# Patient Record
Sex: Male | Born: 1955 | Race: White | Hispanic: No | Marital: Single | Smoking: Current every day smoker
Health system: Southern US, Community
[De-identification: ages and names within clinical notes are randomized; demographics above are authoritative.]

## PROBLEM LIST (undated history)

## (undated) DIAGNOSIS — B20 Human immunodeficiency virus [HIV] disease: Secondary | ICD-10-CM

## (undated) DIAGNOSIS — J449 Chronic obstructive pulmonary disease, unspecified: Secondary | ICD-10-CM

## (undated) DIAGNOSIS — M1990 Unspecified osteoarthritis, unspecified site: Secondary | ICD-10-CM

## (undated) DIAGNOSIS — Z21 Asymptomatic human immunodeficiency virus [HIV] infection status: Secondary | ICD-10-CM

## (undated) HISTORY — DX: Unspecified osteoarthritis, unspecified site: M19.90

## (undated) HISTORY — PX: APPENDECTOMY: SHX54

---

## 1997-12-21 ENCOUNTER — Encounter: Admission: RE | Admit: 1997-12-21 | Discharge: 1997-12-21 | Payer: Self-pay | Admitting: Family Medicine

## 2009-03-04 ENCOUNTER — Emergency Department (HOSPITAL_COMMUNITY): Admission: EM | Admit: 2009-03-04 | Discharge: 2009-03-04 | Payer: Self-pay | Admitting: Family Medicine

## 2009-03-04 ENCOUNTER — Emergency Department (HOSPITAL_COMMUNITY): Admission: EM | Admit: 2009-03-04 | Discharge: 2009-03-04 | Payer: Self-pay | Admitting: Emergency Medicine

## 2009-03-06 ENCOUNTER — Emergency Department (HOSPITAL_COMMUNITY): Admission: EM | Admit: 2009-03-06 | Discharge: 2009-03-06 | Payer: Self-pay | Admitting: Emergency Medicine

## 2009-03-06 ENCOUNTER — Emergency Department (HOSPITAL_COMMUNITY): Admission: EM | Admit: 2009-03-06 | Discharge: 2009-03-06 | Payer: Self-pay | Admitting: Family Medicine

## 2009-11-03 ENCOUNTER — Emergency Department (HOSPITAL_COMMUNITY): Admission: EM | Admit: 2009-11-03 | Discharge: 2009-11-03 | Payer: Self-pay | Admitting: Emergency Medicine

## 2010-03-21 LAB — DIFFERENTIAL
Eosinophils Absolute: 0.3 10*3/uL (ref 0.0–0.7)
Eosinophils Relative: 4 % (ref 0–5)
Lymphs Abs: 2.6 10*3/uL (ref 0.7–4.0)
Monocytes Absolute: 0.5 10*3/uL (ref 0.1–1.0)
Monocytes Relative: 7 % (ref 3–12)

## 2010-03-21 LAB — CBC
HCT: 45.3 % (ref 39.0–52.0)
MCH: 33 pg (ref 26.0–34.0)
MCV: 93.4 fL (ref 78.0–100.0)
Platelets: 219 10*3/uL (ref 150–400)
RBC: 4.85 MIL/uL (ref 4.22–5.81)

## 2010-03-21 LAB — POCT I-STAT, CHEM 8
BUN: 18 mg/dL (ref 6–23)
Calcium, Ion: 1.13 mmol/L (ref 1.12–1.32)
Creatinine, Ser: 1.1 mg/dL (ref 0.4–1.5)
Glucose, Bld: 110 mg/dL — ABNORMAL HIGH (ref 70–99)
Hemoglobin: 17 g/dL (ref 13.0–17.0)
Sodium: 140 mEq/L (ref 135–145)
TCO2: 25 mmol/L (ref 0–100)

## 2010-03-21 LAB — POCT CARDIAC MARKERS
CKMB, poc: <1 ng/mL — ABNORMAL LOW (ref 1.0–8.0)
Myoglobin, poc: 41.1 ng/mL (ref 12–200)
Troponin i, poc: <0.05 ng/mL (ref 0.00–0.09)

## 2010-03-29 LAB — RAPID STREP SCREEN (MED CTR MEBANE ONLY): Streptococcus, Group A Screen (Direct): NEGATIVE

## 2011-11-10 IMAGING — CR DG CHEST 2V
2 series · 2 of 2 positions shown · non-contrast
Comparison: None

CLINICAL DATA: Chest pain and short of breath

CHEST - 2 VIEW

[w chest pa]
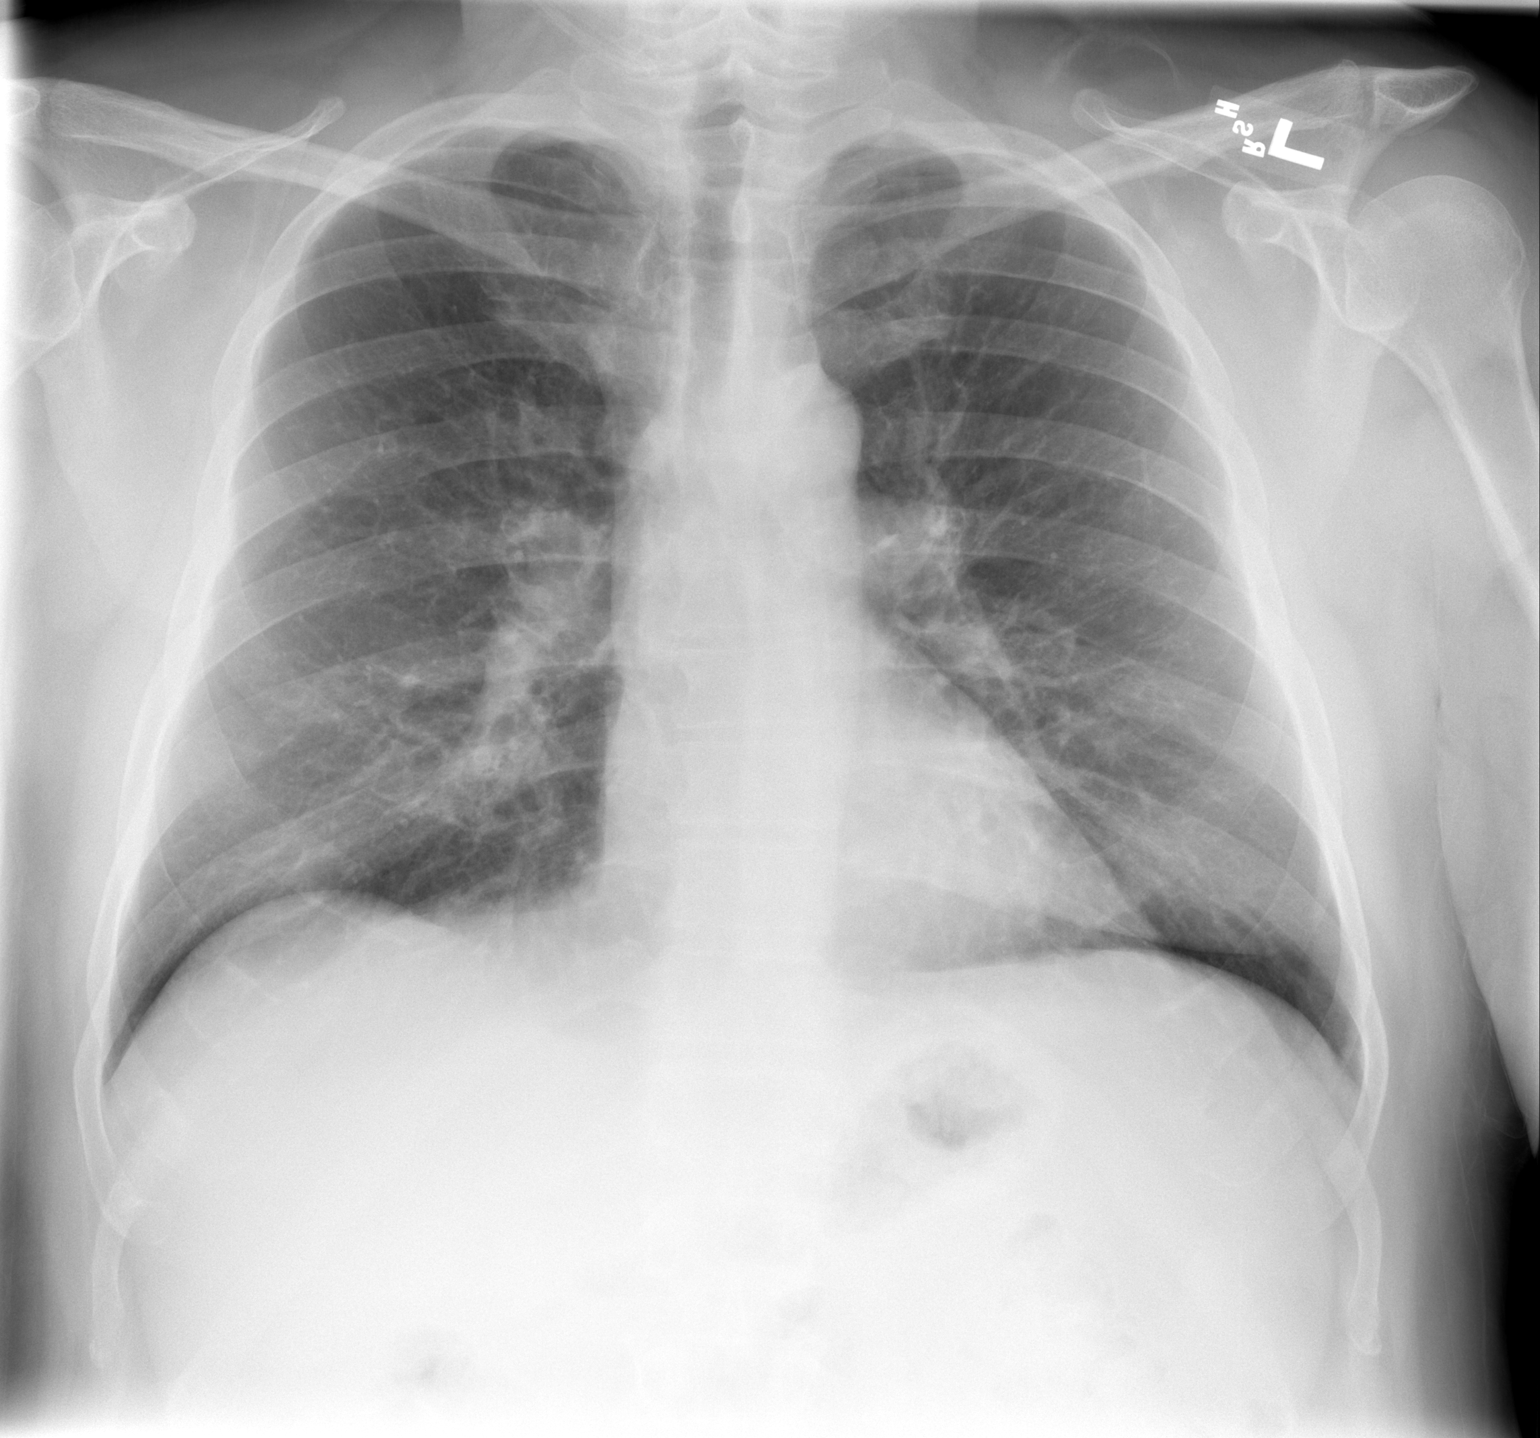

[w chest lat]
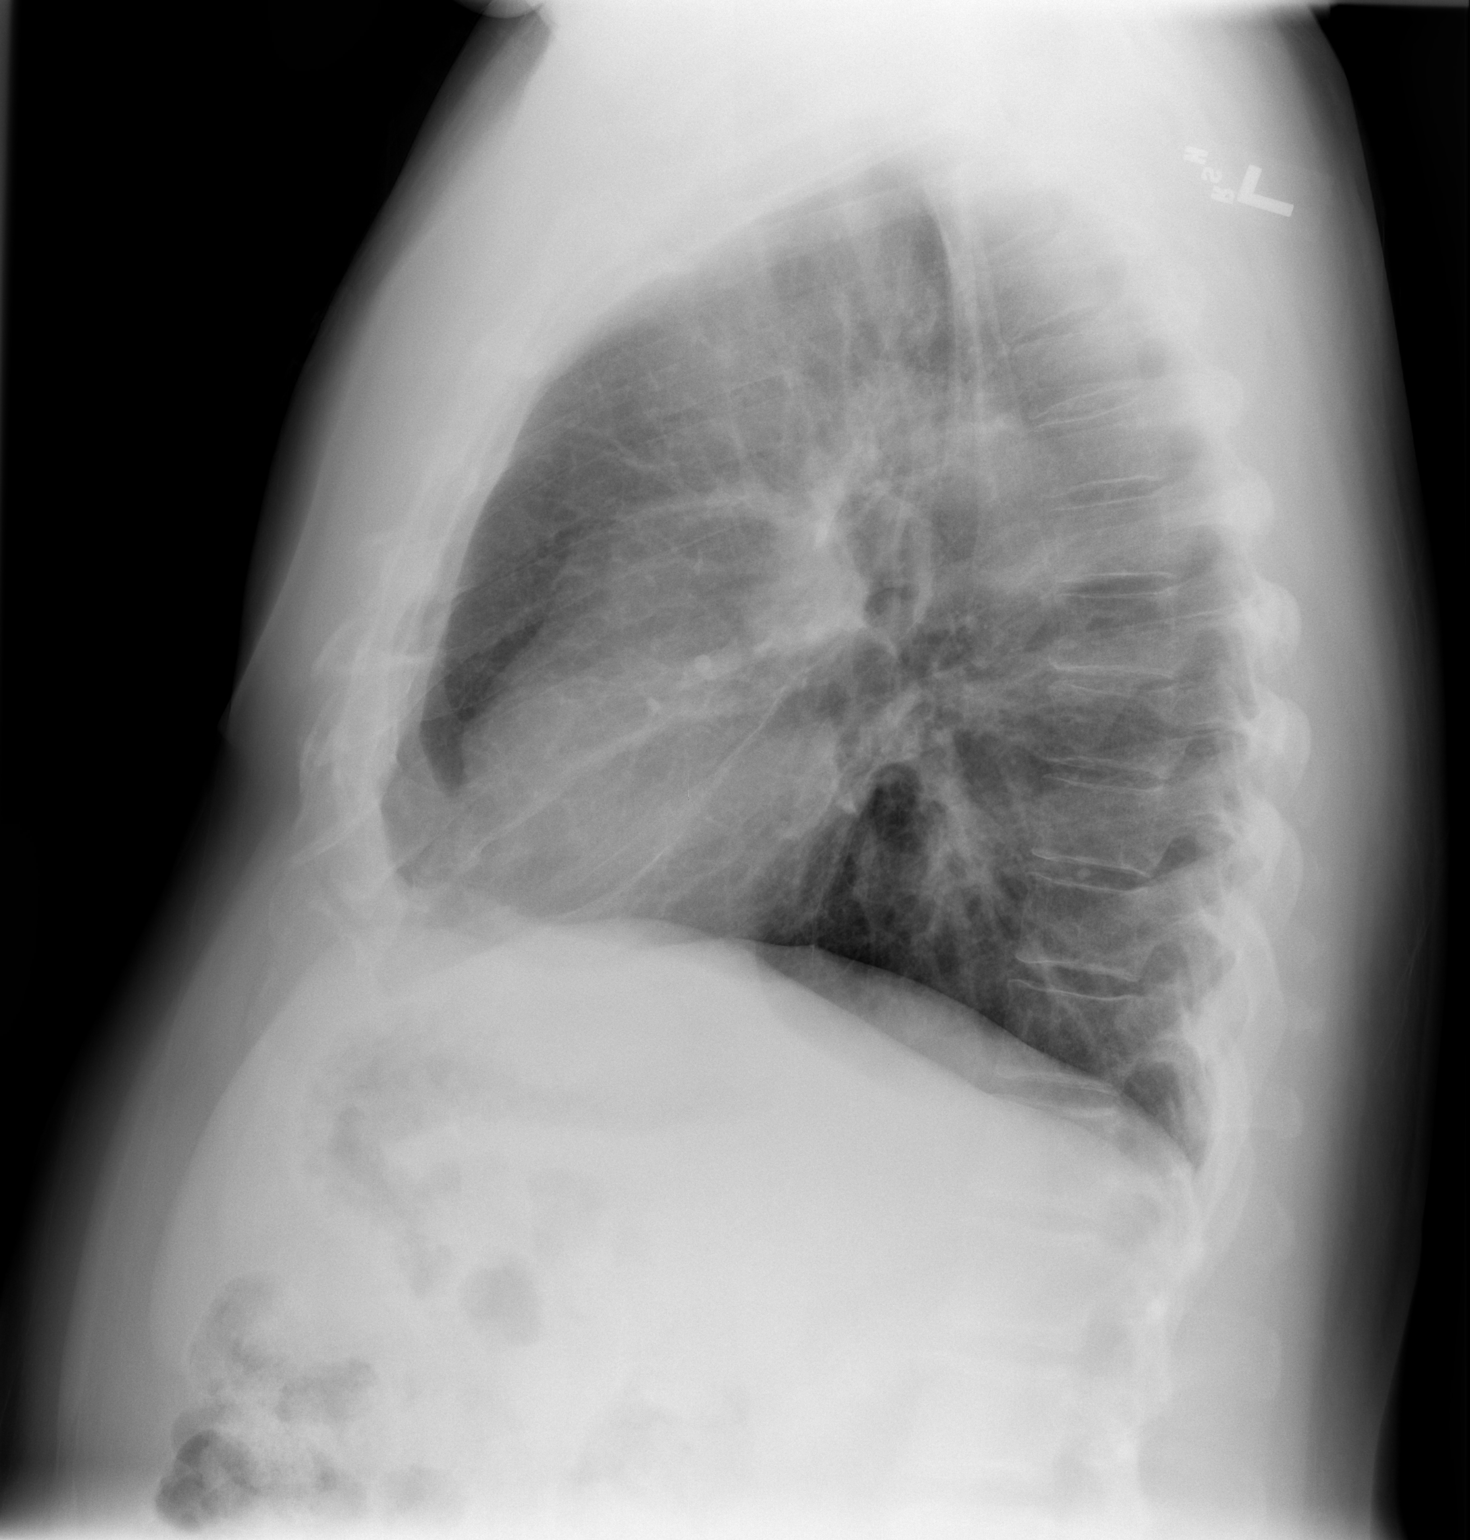

[2 of 2 positions shown; findings below may reference images not displayed]

FINDINGS: Heart size normal.  Negative for heart failure.  Lungs
are clear without infiltrate or effusion.  Negative for mass
lesion.
IMPRESSION: No active cardiopulmonary disease.

## 2013-07-23 DIAGNOSIS — B1920 Unspecified viral hepatitis C without hepatic coma: Secondary | ICD-10-CM | POA: Insufficient documentation

## 2013-07-23 DIAGNOSIS — K7400 Hepatic fibrosis, unspecified: Secondary | ICD-10-CM | POA: Insufficient documentation

## 2018-02-14 DIAGNOSIS — B20 Human immunodeficiency virus [HIV] disease: Secondary | ICD-10-CM | POA: Insufficient documentation

## 2018-02-14 DIAGNOSIS — A539 Syphilis, unspecified: Secondary | ICD-10-CM | POA: Insufficient documentation

## 2018-02-14 DIAGNOSIS — J449 Chronic obstructive pulmonary disease, unspecified: Secondary | ICD-10-CM | POA: Insufficient documentation

## 2018-02-24 DIAGNOSIS — D171 Benign lipomatous neoplasm of skin and subcutaneous tissue of trunk: Secondary | ICD-10-CM | POA: Insufficient documentation

## 2020-01-28 DIAGNOSIS — N401 Enlarged prostate with lower urinary tract symptoms: Secondary | ICD-10-CM | POA: Insufficient documentation

## 2020-08-23 DIAGNOSIS — K6282 Dysplasia of anus: Secondary | ICD-10-CM | POA: Insufficient documentation

## 2021-06-01 DIAGNOSIS — F630 Pathological gambling: Secondary | ICD-10-CM | POA: Insufficient documentation

## 2021-06-01 DIAGNOSIS — F1510 Other stimulant abuse, uncomplicated: Secondary | ICD-10-CM | POA: Insufficient documentation

## 2022-04-22 ENCOUNTER — Other Ambulatory Visit (HOSPITAL_COMMUNITY): Payer: Self-pay

## 2022-04-22 ENCOUNTER — Telehealth: Payer: Self-pay

## 2022-04-22 NOTE — Telephone Encounter (Signed)
[facility] Patient Product/process development scientist completed.    The patient is insured through [employer] Medicare D and has a 0.00 copay.  We will continue to follow to see if copay assistance is needed.  Clearance Coots, CPhT Specialty Pharmacy Patient [facility] for Infectious Disease Phone: [phone] Fax:  [phone]

## 2022-04-24 ENCOUNTER — Other Ambulatory Visit: Payer: Self-pay

## 2022-04-24 ENCOUNTER — Other Ambulatory Visit: Payer: Medicare (Managed Care)

## 2022-04-24 ENCOUNTER — Other Ambulatory Visit (HOSPITAL_COMMUNITY)
Admission: RE | Admit: 2022-04-24 | Discharge: 2022-04-24 | Disposition: A | Discharge status: Home / Self Care | Payer: Medicare (Managed Care) | Source: Ambulatory Visit | Attending: Internal Medicine | Admitting: Internal Medicine

## 2022-04-24 ENCOUNTER — Ambulatory Visit: Payer: Medicare (Managed Care)

## 2022-04-24 DIAGNOSIS — Z113 Encounter for screening for infections with a predominantly sexual mode of transmission: Secondary | ICD-10-CM | POA: Diagnosis present

## 2022-04-24 DIAGNOSIS — B20 Human immunodeficiency virus [HIV] disease: Secondary | ICD-10-CM

## 2022-04-24 DIAGNOSIS — Z79899 Other long term (current) drug therapy: Secondary | ICD-10-CM

## 2022-04-24 LAB — CBC WITH DIFFERENTIAL/PLATELET
Hemoglobin: 15.8 g/dL (ref 13.2–17.1)
Lymphs Abs: 2413 cells/uL (ref 850–3900)
MCHC: 33.9 g/dL (ref 32.0–36.0)
MCV: 94.5 fL (ref 80.0–100.0)
MPV: 9.4 fL (ref 7.5–12.5)
Monocytes Relative: 6.2 %
Neutrophils Relative %: 52.5 %
Platelets: 255 10*3/uL (ref 140–400)
RBC: 4.93 10*6/uL (ref 4.20–5.80)

## 2022-04-25 LAB — COMPLETE METABOLIC PANEL WITH GFR
AG Ratio: 1.4 (calc) (ref 1.0–2.5)
ALT: 25 U/L (ref 9–46)
Albumin: 4.3 g/dL (ref 3.6–5.1)
Alkaline phosphatase (APISO): 54 U/L (ref 35–144)
BUN: 28 mg/dL — ABNORMAL HIGH (ref 7–25)
Chloride: 106 mmol/L (ref 98–110)
Glucose, Bld: 117 mg/dL — ABNORMAL HIGH (ref 65–99)
Potassium: 4.1 mmol/L (ref 3.5–5.3)
Sodium: 139 mmol/L (ref 135–146)
Total Bilirubin: 0.4 mg/dL (ref 0.2–1.2)

## 2022-04-25 LAB — LIPID PANEL
Non-HDL Cholesterol (Calc): 153 mg/dL (calc) — ABNORMAL HIGH (ref <130)
Total CHOL/HDL Ratio: 4.6 (calc) (ref <5.0)

## 2022-04-25 LAB — URINE CYTOLOGY ANCILLARY ONLY
Chlamydia: NEGATIVE
Comment: NEGATIVE
Comment: NORMAL
Neisseria Gonorrhea: NEGATIVE

## 2022-04-25 LAB — T-HELPER CELLS (CD4) COUNT (NOT AT ARMC)
CD4 % Helper T Cell: 36 % (ref 33–65)
CD4 T Cell Abs: 760 /uL (ref 400–1790)

## 2022-04-26 LAB — LIPID PANEL
Cholesterol: 196 mg/dL (ref <200)
HDL: 43 mg/dL (ref >=40)
LDL Cholesterol (Calc): 123 mg/dL (calc) — ABNORMAL HIGH
Triglycerides: 178 mg/dL — ABNORMAL HIGH (ref <150)

## 2022-04-26 LAB — COMPLETE METABOLIC PANEL WITH GFR
AST: 20 U/L (ref 10–35)
BUN/Creatinine Ratio: 20 (calc) (ref 6–22)
CO2: 22 mmol/L (ref 20–32)
Calcium: 9.2 mg/dL (ref 8.6–10.3)
Creat: 1.37 mg/dL — ABNORMAL HIGH (ref 0.70–1.35)
Globulin: 3 g/dL (calc) (ref 1.9–3.7)
Total Protein: 7.3 g/dL (ref 6.1–8.1)
eGFR: 56 mL/min/{1.73_m2} — ABNORMAL LOW (ref >=60)

## 2022-04-26 LAB — RPR TITER: RPR Titer: 1:32 {titer} — ABNORMAL HIGH

## 2022-04-26 LAB — HIV-1 RNA QUANT-NO REFLEX-BLD
HIV 1 RNA Quant: NOT DETECTED Copies/mL
HIV-1 RNA Quant, Log: NOT DETECTED Log cps/mL

## 2022-04-26 LAB — CBC WITH DIFFERENTIAL/PLATELET
Absolute Monocytes: 397 cells/uL (ref 200–950)
Basophils Absolute: 51 cells/uL (ref 0–200)
Basophils Relative: 0.8 %
Eosinophils Absolute: 179 cells/uL (ref 15–500)
Eosinophils Relative: 2.8 %
HCT: 46.6 % (ref 38.5–50.0)
MCH: 32 pg (ref 27.0–33.0)
Neutro Abs: 3360 cells/uL (ref 1500–7800)
RDW: 14.3 % (ref 11.0–15.0)
Total Lymphocyte: 37.7 %
WBC: 6.4 10*3/uL (ref 3.8–10.8)

## 2022-04-26 LAB — T PALLIDUM AB: T Pallidum Abs: POSITIVE — AB

## 2022-04-26 LAB — RPR: RPR Ser Ql: REACTIVE — AB

## 2022-05-01 ENCOUNTER — Emergency Department (HOSPITAL_BASED_OUTPATIENT_CLINIC_OR_DEPARTMENT_OTHER)
Admission: EM | Admit: 2022-05-01 | Discharge: 2022-05-01 | Disposition: A | Discharge status: Home / Self Care | Payer: Medicare (Managed Care) | Attending: Emergency Medicine | Admitting: Emergency Medicine

## 2022-05-01 ENCOUNTER — Telehealth: Payer: Self-pay | Admitting: Pharmacist

## 2022-05-01 ENCOUNTER — Encounter (HOSPITAL_BASED_OUTPATIENT_CLINIC_OR_DEPARTMENT_OTHER): Payer: Self-pay | Admitting: Emergency Medicine

## 2022-05-01 ENCOUNTER — Emergency Department (HOSPITAL_BASED_OUTPATIENT_CLINIC_OR_DEPARTMENT_OTHER): Payer: Medicare (Managed Care) | Admitting: Radiology

## 2022-05-01 ENCOUNTER — Other Ambulatory Visit: Payer: Self-pay

## 2022-05-01 ENCOUNTER — Other Ambulatory Visit (HOSPITAL_BASED_OUTPATIENT_CLINIC_OR_DEPARTMENT_OTHER): Payer: Self-pay

## 2022-05-01 DIAGNOSIS — B20 Human immunodeficiency virus [HIV] disease: Secondary | ICD-10-CM | POA: Insufficient documentation

## 2022-05-01 DIAGNOSIS — Z1152 Encounter for screening for COVID-19: Secondary | ICD-10-CM | POA: Insufficient documentation

## 2022-05-01 DIAGNOSIS — J40 Bronchitis, not specified as acute or chronic: Secondary | ICD-10-CM

## 2022-05-01 DIAGNOSIS — R0602 Shortness of breath: Secondary | ICD-10-CM | POA: Diagnosis present

## 2022-05-01 DIAGNOSIS — J441 Chronic obstructive pulmonary disease with (acute) exacerbation: Secondary | ICD-10-CM

## 2022-05-01 HISTORY — DX: Human immunodeficiency virus (HIV) disease: B20

## 2022-05-01 HISTORY — DX: Chronic obstructive pulmonary disease, unspecified: J44.9

## 2022-05-01 HISTORY — DX: Asymptomatic human immunodeficiency virus (hiv) infection status: Z21

## 2022-05-01 LAB — COMPREHENSIVE METABOLIC PANEL
ALT: 31 U/L (ref 0–44)
AST: 30 U/L (ref 15–41)
Albumin: 4.2 g/dL (ref 3.5–5.0)
Alkaline Phosphatase: 51 U/L (ref 38–126)
Anion gap: 9 (ref 5–15)
BUN: 18 mg/dL (ref 8–23)
CO2: 24 mmol/L (ref 22–32)
Calcium: 8.9 mg/dL (ref 8.9–10.3)
Chloride: 102 mmol/L (ref 98–111)
Creatinine, Ser: 1 mg/dL (ref 0.61–1.24)
GFR, Estimated: >60 mL/min (ref >60)
Glucose, Bld: 108 mg/dL — ABNORMAL HIGH (ref 70–99)
Potassium: 4.1 mmol/L (ref 3.5–5.1)
Sodium: 135 mmol/L (ref 135–145)
Total Bilirubin: 0.4 mg/dL (ref 0.3–1.2)
Total Protein: 7.6 g/dL (ref 6.5–8.1)

## 2022-05-01 LAB — CBC WITH DIFFERENTIAL/PLATELET
Abs Immature Granulocytes: 0.01 10*3/uL (ref 0.00–0.07)
Basophils Absolute: 0 10*3/uL (ref 0.0–0.1)
Basophils Relative: 1 %
Eosinophils Absolute: 0.2 10*3/uL (ref 0.0–0.5)
Eosinophils Relative: 3 %
HCT: 47.9 % (ref 39.0–52.0)
Hemoglobin: 16.3 g/dL (ref 13.0–17.0)
Immature Granulocytes: 0 %
Lymphocytes Relative: 47 %
Lymphs Abs: 2.4 10*3/uL (ref 0.7–4.0)
MCH: 32.4 pg (ref 26.0–34.0)
MCHC: 34 g/dL (ref 30.0–36.0)
MCV: 95.2 fL (ref 80.0–100.0)
Monocytes Absolute: 0.4 10*3/uL (ref 0.1–1.0)
Monocytes Relative: 8 %
Neutro Abs: 2.1 10*3/uL (ref 1.7–7.7)
Neutrophils Relative %: 41 %
Platelets: 192 10*3/uL (ref 150–400)
RBC: 5.03 MIL/uL (ref 4.22–5.81)
RDW: 14.4 % (ref 11.5–15.5)
WBC: 5.2 10*3/uL (ref 4.0–10.5)
nRBC: 0 % (ref 0.0–0.2)

## 2022-05-01 LAB — TROPONIN I (HIGH SENSITIVITY): Troponin I (High Sensitivity): 6 ng/L (ref <18)

## 2022-05-01 LAB — SARS CORONAVIRUS 2 BY RT PCR: SARS Coronavirus 2 by RT PCR: NEGATIVE

## 2022-05-01 MED ORDER — IPRATROPIUM-ALBUTEROL 0.5-2.5 (3) MG/3ML IN SOLN
3.0000 mL | Freq: Once | RESPIRATORY_TRACT | Status: AC
  Administered 2022-05-01: 3 mL via RESPIRATORY_TRACT
  Filled 2022-05-01: qty 3

## 2022-05-01 MED ORDER — PREDNISONE 50 MG PO TABS
60.0000 mg | ORAL_TABLET | Freq: Once | ORAL | Status: AC
  Administered 2022-05-01: 60 mg via ORAL
  Filled 2022-05-01: qty 1

## 2022-05-01 MED ORDER — AZITHROMYCIN 250 MG PO TABS
500.0000 mg | ORAL_TABLET | Freq: Once | ORAL | Status: AC
  Administered 2022-05-01: 500 mg via ORAL
  Filled 2022-05-01: qty 2

## 2022-05-01 MED ORDER — AZITHROMYCIN 250 MG PO TABS
250.0000 mg | ORAL_TABLET | Freq: Every day | ORAL | 0 refills | Status: DC
  Filled 2022-05-01: qty 4, 4d supply, fill #0

## 2022-05-01 MED ORDER — ALBUTEROL SULFATE HFA 108 (90 BASE) MCG/ACT IN AERS
2.0000 | INHALATION_SPRAY | RESPIRATORY_TRACT | 0 refills | Status: DC | PRN
  Filled 2022-05-01: qty 6.7, 17d supply, fill #0

## 2022-05-01 MED ORDER — PREDNISONE 10 MG PO TABS
40.0000 mg | ORAL_TABLET | Freq: Every day | ORAL | 0 refills | Status: AC
  Filled 2022-05-01: qty 16, 4d supply, fill #0

## 2022-05-01 NOTE — ED Notes (Signed)
Discharge instructions, follow up care, and prescriptions reviewed and explained, pt verbalized understanding and had no further questions on d/c. Pt caox4, NAD, taken to vehicle via wheelchair without incident.

## 2022-05-01 NOTE — Telephone Encounter (Signed)
Reviewing patient's chart for appointment week and noted RPR titer 1:32. Are past RPR titers available? Imagine this titer is up from prior ones if available and would need to be treated ideally before his appointment next week. I have openings if needed. Please advise.  Thanks!  Margarite Gouge, PharmD, CPP, BCIDP, AAHIVP Clinical Pharmacist Practitioner Infectious Diseases Clinical Pharmacist [facility] for [facility] Disease

## 2022-05-01 NOTE — ED Triage Notes (Addendum)
Pt arrives to ED with c/o SOB x5 days. He notes fever and chills at onset. He notes new onset orthopnea and DOE. He notes restlessness. Hx COPD and HIV. [place]. Current smoker.

## 2022-05-01 NOTE — ED Provider Notes (Signed)
[facility] AT [facility] Provider Note   CSN: [id number] Arrival date & time: 05/01/22  4098     History  Chief Complaint  Patient presents with   Shortness of Breath    Robert Crosby is a 67 y.o. male.  Patient is a 67 year old male with a past medical history of COPD and HIV on Edwyna Shell presenting to the emergency department with shortness of breath.  Patient states over the last 4 to 5 days he said a cough and congestion with mild sore throat.  He states that he had a fever on the first day that is since resolved.  He states that throughout the week he has had worsening shortness of breath and fatigue and gets tired even after just eating.  He states that he has associated chest pressure.  He denies any lower extremity swelling.  The history is provided by the patient.  Shortness of Breath      Home Medications Prior to Admission medications   Medication Sig Start Date End Date Taking? Authorizing Provider  albuterol (VENTOLIN HFA) 108 (90 Base) MCG/ACT inhaler Inhale 2 puffs into the lungs every 4 (four) hours as needed for wheezing or shortness of breath. 05/01/22  Yes Theresia Lo, Turkey K, DO  azithromycin (ZITHROMAX) 250 MG tablet Take 1 tablet (250 mg total) by mouth daily. Starting tomorrow, take 1 tab every day until finished. 05/01/22  Yes Kingsley, Nyree Yonker K, DO  BIKTARVY 50-200-25 MG TABS tablet Take 1 tablet by mouth daily. 04/23/22  Yes [provider]  finasteride (PROSCAR) 5 MG tablet Take 5 mg by mouth daily. 01/09/22  Yes [provider]  INCRUSE ELLIPTA 62.5 MCG/ACT AEPB Inhale 1 puff into the lungs daily. 04/23/22  Yes [provider]  omeprazole (PRILOSEC) 40 MG capsule Take 40 mg by mouth daily. 03/14/22  Yes [provider]  predniSONE (DELTASONE) 10 MG tablet Take 4 tablets (40 mg total) by mouth daily for 4 days. 05/01/22 05/05/22 Yes Kingsley, Benetta Spar K, DO  zolpidem (AMBIEN) 5 MG tablet Take 5 mg  by mouth at bedtime as needed. 04/04/22  Yes [provider]      Allergies    Patient has no known allergies.    Review of Systems   Review of Systems  Respiratory:  Positive for shortness of breath.     Physical Exam Updated Vital Signs BP (!) 153/110   Pulse 100   Temp 97.8 F (36.6 C) (Oral)   Resp (!) 25   Ht  (1.778 m)   Wt 106.6 kg   SpO2 99%   BMI 33.72 kg/m  Physical Exam Vitals and nursing note reviewed.  Constitutional:      General: He is not in acute distress.    Appearance: He is well-developed. He is obese.  HENT:     Head: Normocephalic and atraumatic.     Mouth/Throat:     Mouth: Mucous membranes are moist.     Pharynx: Oropharynx is clear.  Eyes:     Extraocular Movements: Extraocular movements intact.  Cardiovascular:     Rate and Rhythm: Normal rate and regular rhythm.     Heart sounds: Normal heart sounds.  Pulmonary:     Effort: Pulmonary effort is normal.     Breath sounds: Examination of the right-lower field reveals wheezing. Examination of the left-lower field reveals wheezing. Wheezing (end-expiratory) present.     Comments: Bronchospastic cough on exam Abdominal:     Palpations: Abdomen is soft.  Tenderness: There is no abdominal tenderness.  Musculoskeletal:        General: Normal range of motion.     Cervical back: Normal range of motion and neck supple.     Right lower leg: No edema.     Left lower leg: No edema.  Skin:    General: Skin is warm and dry.  Neurological:     General: No focal deficit present.     Mental Status: He is alert and oriented to person, place, and time.  Psychiatric:        Mood and Affect: Mood normal.        Behavior: Behavior normal.     ED Results / Procedures / Treatments   Labs (all labs ordered are listed, but only abnormal results are displayed) Labs Reviewed  COMPREHENSIVE METABOLIC PANEL - Abnormal; Notable for the following components:      Result Value   Glucose, Bld  108 (*)    All other components within normal limits  SARS CORONAVIRUS 2 BY RT PCR  CBC WITH DIFFERENTIAL/PLATELET  TROPONIN I (HIGH SENSITIVITY)    EKG None  Radiology DG Chest 2 View  Result Date: 05/01/2022 CLINICAL DATA:  Provided history: Shortness of breath. Dizziness. Chest tightness. History of COPD and HIV. EXAM: CHEST - 2 VIEW COMPARISON:  Prior chest radiographs 11/03/2009. FINDINGS: Heart size within normal limits. No appreciable airspace consolidation. No evidence of pleural effusion or pneumothorax. No acute osseous abnormality identified. Degenerative changes of the spine. IMPRESSION: No evidence of active cardiopulmonary disease. Electronically Signed   By: Jackey Loge D.O.   On: 05/01/2022 09:14    Procedures Procedures    Medications Ordered in ED Medications  ipratropium-albuterol (DUONEB) 0.5-2.5 (3) MG/3ML nebulizer solution 3 mL (3 mLs Nebulization Given 05/01/22 0909)  predniSONE (DELTASONE) tablet 60 mg (60 mg Oral Given 05/01/22 0908)  ipratropium-albuterol (DUONEB) 0.5-2.5 (3) MG/3ML nebulizer solution 3 mL (3 mLs Nebulization Given 05/01/22 0941)  azithromycin (ZITHROMAX) tablet 500 mg (500 mg Oral Given 05/01/22 1008)    ED Course/ Medical Decision Making/ A&P Clinical Course as of 05/01/22 1022  Wed May 01, 2022  0936 Patient's labs within normal range, initial troponin negative.  Patient in several days of symptoms so single troponin is sufficient.  Chest x-ray without any signs of pneumonia but with his history of COPD and increased change of sputum, he will be treated for bronchitis with azithromycin.  He still has some wheezing and chest tightness on exam and will be given additional DuoNeb. [VK]  1021 , The patient reports that symptoms have significantly improved.  He has trace end expiratory wheeze on exam.  He is stable for discharge home with primary care follow-up and was given strict return precautions. [VK]    Clinical Course User Index [VK]  Rexford Maus, DO                             Medical Decision Making This patient presents to the ED with chief complaint(s) of cough, SOB with pertinent past medical history of COPD, HIV which further complicates the presenting complaint. The complaint involves an extensive differential diagnosis and also carries with it a high risk of complications and morbidity.    The differential diagnosis includes patient does have some wheezing on exam concerning for COPD exacerbation, considering pneumonia, viral syndrome, pneumothorax, pulmonary edema, pleural effusion, ACS, anemia  Additional history obtained: Additional history obtained from N/A  Records reviewed N/A  ED Course and Reassessment: On patient's arrival to the emergency department he is satting well on room air no acute respiratory distress.  The patient does have some end expiratory wheeze and a bronchospastic cough, concerning for COPD exacerbation and he will be treated with steroids and DuoNeb.  The patient will have labs including troponin and chest x-ray performed as well as COVID swab.  EKG on arrival showed normal sinus rhythm without acute ischemic changes.  The patient will be closely reassessed.  Independent labs interpretation:  The following labs were independently interpreted: within normal range  Independent visualization of imaging: - I independently visualized the following imaging with scope of interpretation limited to determining acute life threatening conditions related to emergency care: CXR, which revealed no acute disease  Consultation: - Consulted or discussed management/test interpretation w/ external professional: N/A  Consideration for admission or further workup: Patient has no emergent conditions requiring admission or further work-up at this time and is stable for discharge home with primary care follow-up  Social Determinants of health: N/A    Amount and/or Complexity of Data  Reviewed Labs: ordered. Radiology: ordered.  Risk Prescription drug management.          Final Clinical Impression(s) / ED Diagnoses Final diagnoses:  COPD exacerbation  Bronchitis    Rx / DC Orders ED Discharge Orders          Ordered    azithromycin (ZITHROMAX) 250 MG tablet  Daily        05/01/22 0938    predniSONE (DELTASONE) 10 MG tablet  Daily        05/01/22 0938    albuterol (VENTOLIN HFA) 108 (90 Base) MCG/ACT inhaler  Every 4 hours PRN        05/01/22 1021              Elayne Snare K, DO 05/01/22 1022

## 2022-05-01 NOTE — Discharge Instructions (Signed)
You were seen in the emergency department for your cough and shortness of breath.  You were wheezing and had signs of a COPD exacerbation and likely a bronchitis.  We have given you steroids and breathing treatments and started you on antibiotics.  You should complete your antibiotics and steroids as prescribed and continue to use your inhalers.  You should follow-up with your primary doctor in the next few days to have your symptoms rechecked.  You should return to the emergency department if you have significantly worsening shortness of breath, fevers despite the antibiotics, severe chest pain or if you have any other new or concerning symptoms.

## 2022-05-06 ENCOUNTER — Encounter: Payer: Self-pay | Admitting: Internal Medicine

## 2022-05-08 ENCOUNTER — Other Ambulatory Visit (HOSPITAL_COMMUNITY): Payer: Self-pay

## 2022-05-08 ENCOUNTER — Encounter: Payer: Self-pay | Admitting: Internal Medicine

## 2022-05-08 ENCOUNTER — Other Ambulatory Visit (HOSPITAL_COMMUNITY)
Admission: RE | Admit: 2022-05-08 | Discharge: 2022-05-08 | Disposition: A | Discharge status: Home / Self Care | Payer: Medicare (Managed Care) | Source: Ambulatory Visit | Attending: Internal Medicine | Admitting: Internal Medicine

## 2022-05-08 ENCOUNTER — Other Ambulatory Visit: Payer: Self-pay

## 2022-05-08 ENCOUNTER — Other Ambulatory Visit (HOSPITAL_BASED_OUTPATIENT_CLINIC_OR_DEPARTMENT_OTHER): Payer: Self-pay

## 2022-05-08 ENCOUNTER — Ambulatory Visit (INDEPENDENT_AMBULATORY_CARE_PROVIDER_SITE_OTHER): Payer: Medicare (Managed Care) | Admitting: Internal Medicine

## 2022-05-08 ENCOUNTER — Ambulatory Visit (INDEPENDENT_AMBULATORY_CARE_PROVIDER_SITE_OTHER): Payer: Medicare (Managed Care) | Admitting: Pharmacist

## 2022-05-08 VITALS — BP 150/93 | Wt 232.0 lb

## 2022-05-08 DIAGNOSIS — B20 Human immunodeficiency virus [HIV] disease: Secondary | ICD-10-CM | POA: Diagnosis present

## 2022-05-08 MED ORDER — PITAVASTATIN CALCIUM 4 MG PO TABS
1.0000 | ORAL_TABLET | Freq: Every day | ORAL | 5 refills | Status: DC
Start: 2022-05-08 — End: 2023-01-06
  Filled 2022-05-08 (×2): qty 30, 30d supply, fill #0
  Filled 2022-06-03: qty 30, 30d supply, fill #1
  Filled 2022-11-29: qty 30, 30d supply, fill #2

## 2022-05-08 MED ORDER — PITAVASTATIN CALCIUM 4 MG PO TABS
1.0000 | ORAL_TABLET | Freq: Every day | ORAL | 5 refills | Status: DC
Start: 2022-05-08 — End: 2022-05-08
  Filled 2022-05-08: qty 30, 30d supply, fill #0

## 2022-05-08 MED ORDER — PITAVASTATIN CALCIUM 4 MG PO TABS
1.0000 | ORAL_TABLET | Freq: Every day | ORAL | 5 refills | Status: DC
  Filled 2022-05-08: qty 30, 30d supply, fill #0

## 2022-05-08 MED ORDER — BIKTARVY 50-200-25 MG PO TABS
1.0000 | ORAL_TABLET | Freq: Every day | ORAL | 2 refills | Status: DC
Start: 2022-05-08 — End: 2022-08-28
  Filled 2022-05-08 – 2022-05-09 (×2): qty 30, 30d supply, fill #0
  Filled 2022-06-04 – 2022-07-04 (×3): qty 30, 30d supply, fill #1
  Filled 2022-07-30: qty 30, 30d supply, fill #2

## 2022-05-08 NOTE — Progress Notes (Signed)
[facility]     HPI: Robert Crosby is a 67 y.o. male  with HIV presents to establish care. Followed by [name] [place](left [place] [facility]) returned to [place] in 2024. 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  05/01/21: RPR 1:16->doxy x 1 weeks"didn't work"->[facility] July 2023 Date of diagnosis: 2020 ART exposure: Biktarvy Past Ois: none Risk factors: MSM,  IVDA- remote in the 1960s(pt was HIV neative prior to 2020 testig) Partners in last 2months 0, in the last 12 months 0.  Anal sex receptive yes, insertive yes. Contraception some time Vaginal penile sex, contraception some times  Social: Occupation: Personnel officer Housing: house with neice Support: neice Understanding of HIV: good Etoh/drug/tobacco use: Social/prior drug use, last time used drugs was acid 2 years ago/quit 18 days aog Past Medical History:  Diagnosis Date   COPD (chronic obstructive pulmonary disease) (HCC)    HIV (human immunodeficiency virus infection) (HCC)     No past surgical history on file.  No family history on file. Current Outpatient Medications on File Prior to Visit  Medication Sig Dispense Refill   albuterol (VENTOLIN HFA) 108 (90 Base) MCG/ACT inhaler Inhale 2 puffs into the lungs every 4 (four) hours as needed for wheezing or shortness of breath. 6.7 g 0   azithromycin (ZITHROMAX) 250 MG tablet Take 1 tablet (250 mg total) by mouth daily. Starting tomorrow, take 1 tab every day until finished. 4 tablet 0   BIKTARVY 50-200-25 MG TABS tablet Take 1 tablet by mouth daily.     finasteride (PROSCAR) 5 MG tablet Take 5 mg by mouth daily.     INCRUSE ELLIPTA 62.5 MCG/ACT AEPB Inhale 1 puff into the lungs daily.     zolpidem (AMBIEN) 5 MG tablet Take 5 mg by mouth at bedtime as needed.     omeprazole (PRILOSEC) 40 MG capsule Take 40 mg by mouth daily.     No  current facility-administered medications on file prior to visit.    No Known Allergies    Lab Results HIV 1 RNA Quant (Copies/mL)  Date Value  04/24/2022 Not Detected   CD4 T Cell Abs (/uL)  Date Value  04/24/2022 760   No results found for: "HIV1GENOSEQ" Lab Results  Component Value Date   WBC 5.2 05/01/2022   HGB 16.3 05/01/2022   HCT 47.9 05/01/2022   MCV 95.2 05/01/2022   PLT 192 05/01/2022    Lab Results  Component Value Date   CREATININE 1.00 05/01/2022   BUN 18 05/01/2022   NA 135 05/01/2022   K 4.1 05/01/2022   CL 102 05/01/2022   CO2 24 05/01/2022   Lab Results  Component Value Date   ALT 31 05/01/2022   AST 30 05/01/2022   ALKPHOS 51 05/01/2022   BILITOT 0.4 05/01/2022    Lab Results  Component Value Date   CHOL 196 04/24/2022   TRIG 178 (H) 04/24/2022   HDL 43 04/24/2022   LDLCALC 123 (H) 04/24/2022   No results found for: "HAV" No results found for: "HEPBSAG", "HEPBSAB" No results found for: "HCVAB" Lab Results  Component Value Date   CHLAMYDIAWP Negative 04/24/2022   N Negative 04/24/2022   No results found for: "GCPROBEAPT" No results found for: "QUANTGOLD"  Assessment/Plan #HIV -CD4 760, VL ND, on 4/17 -Continue ART Plan -A1c and labs  as below -Follow up in one month  #Depresion -F/U with PCP  #Tobaxxo dependence #Vaccination COVID Flu Monkeypox PCV 13 on 05/26/2018 Meningitis x2 dodese 2020 HepA-serology HEpB x3 dose seties. Per records pt HBV core Ab +, sab neg, aAB neg, VL 0, C.W natural immunity Tdap 2020 Shingles 2020, 2020   #Health maintenance -Quantiferon-order today -RPR PEN x 3 doses (initial RPR 1:1204), now 1:32 on 4/17 -HCV-Ab +, last VL ND. order today ab and VL -GC urine negaitve, oral and rectal today -Lipid-petavastain mg , will get labs and A1c at next visit -Dysplasia screen F/M-HSIL->anoscopy in 2023 repeat in 9 months. Refer to GI -Colonoscopy- A year ago    Danelle Earthly, MD [facility] for Infectious [facility]   I have personally spent 85 minutes involved in face-to-face and non-face-to-face activities for this patient on the day of the visit. Professional time spent includes the following activities: Preparing to see the patient (review of tests), Obtaining and/or reviewing separately obtained history (admission/discharge record), Performing a medically appropriate examination and/or evaluation , Ordering medications/tests/procedures, referring and communicating with other health care professionals, Documenting clinical information in the EMR, Independently interpreting results (not separately reported), Communicating results to the patient/family/caregiver, Counseling and educating the patient/family/caregiver and Care coordination (not separately reported).

## 2022-05-08 NOTE — Progress Notes (Signed)
05/08/2022  HPI: Robert Crosby is a 67 y.o. male who presents to the [facility] clinic today to establish ongoing care for his HIV infection.   There are no problems to display for this patient.   Patient's Medications  New Prescriptions   No medications on file  Previous Medications   ALBUTEROL (VENTOLIN HFA) 108 (90 BASE) MCG/ACT INHALER    Inhale 2 puffs into the lungs every 4 (four) hours as needed for wheezing or shortness of breath.   AZITHROMYCIN (ZITHROMAX) 250 MG TABLET    Take 1 tablet (250 mg total) by mouth daily. Starting tomorrow, take 1 tab every day until finished.   FINASTERIDE (PROSCAR) 5 MG TABLET    Take 5 mg by mouth daily.   INCRUSE ELLIPTA 62.5 MCG/ACT AEPB    Inhale 1 puff into the lungs daily.   OMEPRAZOLE (PRILOSEC) 40 MG CAPSULE    Take 40 mg by mouth daily.   ZOLPIDEM (AMBIEN) 5 MG TABLET    Take 5 mg by mouth at bedtime as needed.  Modified Medications   Modified Medication Previous Medication   BICTEGRAVIR-EMTRICITABINE-TENOFOVIR AF (BIKTARVY) 50-200-25 MG TABS TABLET BIKTARVY 50-200-25 MG TABS tablet      Take 1 tablet by mouth daily.    Take 1 tablet by mouth daily.   PITAVASTATIN CALCIUM 4 MG TABS Pitavastatin Calcium 4 MG TABS      Take 1 tablet (4 mg total) by mouth daily.    Take 1 tablet (4 mg total) by mouth daily.  Discontinued Medications   No medications on file    Allergies: No Known Allergies  Past Medical History: Past Medical History:  Diagnosis Date   COPD (chronic obstructive pulmonary disease) (HCC)    HIV (human immunodeficiency virus infection) (HCC)     Social History: Social History   Socioeconomic History   Marital status: Single    Spouse name: Not on file   Number of children: Not on file   Years of education: Not on file   Highest education level: Not on file  Occupational History   Not on file  Tobacco Use   Smoking status: Every Day    Types: Cigarettes   Smokeless tobacco: Never   Tobacco comments:    On a  break  Substance and Sexual Activity   Alcohol use: Not on file   Drug use: Not on file   Sexual activity: Not on file  Other Topics Concern   Not on file  Social History Narrative   Not on file   Social Determinants of Health   Financial Resource Strain: Not on file  Food Insecurity: Not on file  Transportation Needs: Not on file  Physical Activity: Not on file  Stress: Not on file  Social Connections: Not on file    Labs: Lab Results  Component Value Date   HIV1RNAQUANT Not Detected 04/24/2022   CD4TABS 760 04/24/2022    RPR and STI Lab Results  Component Value Date   LABRPR REACTIVE (A) 04/24/2022   RPRTITER 1:32 (H) 04/24/2022    STI Results GC CT  04/24/2022  9:20 AM Negative  Negative     Hepatitis B No results found for: "HEPBSAB", "HEPBSAG", "HEPBCAB" Hepatitis C No results found for: "HEPCAB", "HCVRNAPCRQN" Hepatitis A No results found for: "HAV" Lipids: Lab Results  Component Value Date   CHOL 196 04/24/2022   TRIG 178 (H) 04/24/2022   HDL 43 04/24/2022   CHOLHDL 4.6 04/24/2022   LDLCALC 123 (H) 04/24/2022  Current HIV Regimen: Biktarvy  Assessment: Robert Crosby has no issues with his Biktarvy. He would like to get it locally so will send to [facility] at [facility] and have it mailed to his house. He tolerates it well with no side effects. Advised of our clinic services today and that we have samples here if he ever needs them. Gave him our card and told him to reach out with any issues.   Plan: - Systems analyst - Mail from [facility] L. Sherina Stammer, PharmD, BCIDP, AAHIVP, CPP Clinical Pharmacist Practitioner Infectious Diseases Clinical Pharmacist [facility] 05/08/2022, 4:27 PM

## 2022-05-09 ENCOUNTER — Other Ambulatory Visit (HOSPITAL_COMMUNITY): Payer: Self-pay

## 2022-05-09 ENCOUNTER — Other Ambulatory Visit: Payer: Self-pay

## 2022-05-09 LAB — CYTOLOGY, (ORAL, ANAL, URETHRAL) ANCILLARY ONLY
Chlamydia: NEGATIVE
Chlamydia: NEGATIVE
Comment: NEGATIVE
Comment: NEGATIVE
Comment: NORMAL
Comment: NORMAL
Neisseria Gonorrhea: NEGATIVE
Neisseria Gonorrhea: NEGATIVE

## 2022-05-09 LAB — HEPATITIS B SURFACE ANTIBODY,QUALITATIVE: Hep B S Ab: NONREACTIVE

## 2022-05-09 LAB — HEPATITIS A ANTIBODY, TOTAL: Hepatitis A AB,Total: REACTIVE — AB

## 2022-05-09 LAB — HEPATITIS B SURFACE ANTIGEN: Hepatitis B Surface Ag: NONREACTIVE

## 2022-05-10 LAB — QUANTIFERON-TB GOLD PLUS
TB1-NIL: 0.11 IU/mL
TB2-NIL: 0.09 IU/mL

## 2022-05-11 LAB — HEPATITIS C RNA QUANTITATIVE: HCV Quantitative Log: <1.18 log IU/mL

## 2022-05-12 LAB — HEMOGLOBIN A1C
Hgb A1c MFr Bld: 6.2 % of total Hgb — ABNORMAL HIGH (ref <5.7)
Mean Plasma Glucose: 131 mg/dL
eAG (mmol/L): 7.3 mmol/L

## 2022-05-12 LAB — QUANTIFERON-TB GOLD PLUS
Mitogen-NIL: 7.59 IU/mL
NIL: 0.03 IU/mL
QuantiFERON-TB Gold Plus: NEGATIVE

## 2022-05-12 LAB — HEPATITIS C RNA QUANTITATIVE: HCV RNA, PCR, QN: <15 IU/mL

## 2022-05-12 LAB — HEPATITIS B CORE ANTIBODY, TOTAL: Hep B Core Total Ab: REACTIVE — AB

## 2022-05-12 LAB — HCV RNA,QUANTITATIVE REAL TIME PCR
HCV Quantitative Log: <1.18 Log IU/mL
HCV RNA, PCR, QN: <15 IU/mL

## 2022-05-12 LAB — HEPATITIS C ANTIBODY: Hepatitis C Ab: REACTIVE — AB

## 2022-05-14 ENCOUNTER — Other Ambulatory Visit: Payer: Self-pay

## 2022-05-15 ENCOUNTER — Other Ambulatory Visit: Payer: Self-pay

## 2022-05-15 ENCOUNTER — Other Ambulatory Visit (HOSPITAL_COMMUNITY): Payer: Self-pay

## 2022-06-04 ENCOUNTER — Other Ambulatory Visit: Payer: Self-pay

## 2022-06-04 ENCOUNTER — Other Ambulatory Visit (HOSPITAL_COMMUNITY): Payer: Self-pay

## 2022-06-06 ENCOUNTER — Other Ambulatory Visit: Payer: Self-pay

## 2022-06-07 ENCOUNTER — Other Ambulatory Visit (HOSPITAL_COMMUNITY): Payer: Self-pay

## 2022-06-10 ENCOUNTER — Ambulatory Visit (INDEPENDENT_AMBULATORY_CARE_PROVIDER_SITE_OTHER): Payer: Medicare (Managed Care) | Admitting: Internal Medicine

## 2022-06-10 ENCOUNTER — Other Ambulatory Visit: Payer: Self-pay | Admitting: Internal Medicine

## 2022-06-10 ENCOUNTER — Encounter: Payer: Self-pay | Admitting: Internal Medicine

## 2022-06-10 ENCOUNTER — Other Ambulatory Visit: Payer: Self-pay

## 2022-06-10 DIAGNOSIS — B20 Human immunodeficiency virus [HIV] disease: Secondary | ICD-10-CM | POA: Diagnosis not present

## 2022-06-10 DIAGNOSIS — Z23 Encounter for immunization: Secondary | ICD-10-CM | POA: Diagnosis not present

## 2022-06-10 LAB — CBC WITH DIFFERENTIAL/PLATELET
Basophils Relative: 0.8 %
Eosinophils Relative: 2 %
MCHC: 34.2 g/dL (ref 32.0–36.0)
Neutro Abs: 3144 cells/uL (ref 1500–7800)
Neutrophils Relative %: 52.4 %
Platelets: 277 10*3/uL (ref 140–400)
RBC: 4.71 10*6/uL (ref 4.20–5.80)
RDW: 13.3 % (ref 11.0–15.0)

## 2022-06-10 NOTE — Progress Notes (Signed)
[facility]     [id number] Menton is a 67 y.o. male  with HIV presents to establish care. Followed by [name] [place](left [place] 1994) returned to [place] in 2024. 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  05/01/21: RPR 1:16->doxy x 1 weeks"didn't work"->[facility] July 2023 Today: no new complaints. Tolerating ART Date of diagnosis: 2020 ART exposure: Biktarvy Past Ois: none Risk factors: MSM,  IVDA- remote in the 1960s(pt was HIV neative prior to 2020 testig) Partners in last 2months 0, in the last 12 months 0.  Anal sex receptive yes, insertive yes. 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 No pertinent surgical history.  History reviewed. No pertinent family history. Current Outpatient Medications on File Prior to Visit  Medication Sig Dispense Refill   albuterol (VENTOLIN HFA) 108 (90 Base) MCG/ACT inhaler Inhale 2 puffs into the lungs every 4 (four) hours as needed for wheezing or shortness of breath. 6.7 g 0   azithromycin (ZITHROMAX) 250 MG tablet Take 1 tablet (250 mg total) by mouth daily. Starting tomorrow, take 1 tab every day until finished. 4 tablet 0   bictegravir-emtricitabine-tenofovir AF (BIKTARVY) 50-200-25 MG TABS tablet Take 1 tablet by mouth daily. 30 tablet 2   finasteride (PROSCAR) 5 MG tablet Take 5 mg by mouth daily.     INCRUSE ELLIPTA 62.5 MCG/ACT AEPB Inhale 1 puff into the lungs daily.     omeprazole (PRILOSEC)  40 MG capsule Take 40 mg by mouth daily.     Pitavastatin Calcium 4 MG TABS Take 1 tablet (4 mg total) by mouth daily. 30 tablet 5   zolpidem (AMBIEN) 5 MG tablet Take 5 mg by mouth at bedtime as needed.     No current facility-administered medications on file prior to visit.    No Known Allergies    Lab Results HIV 1 RNA Quant (Copies/mL)  Date Value  04/24/2022 Not Detected   CD4 T Cell Abs (/uL)  Date Value  04/24/2022 760   No results found for: "HIV1GENOSEQ" Lab Results  Component Value Date   WBC 5.2 05/01/2022   HGB 16.3 05/01/2022   HCT 47.9 05/01/2022   MCV 95.2 05/01/2022   PLT 192 05/01/2022    Lab Results  Component Value Date   CREATININE 1.00 05/01/2022   BUN 18 05/01/2022   NA 135 05/01/2022   K 4.1 05/01/2022   CL 102 05/01/2022   CO2 24 05/01/2022   Lab Results  Component Value Date   ALT 31 05/01/2022   AST 30 05/01/2022   ALKPHOS 51 05/01/2022   BILITOT 0.4 05/01/2022    Lab Results  Component Value Date   CHOL 196 04/24/2022   TRIG 178 (H) 04/24/2022   HDL 43 04/24/2022   LDLCALC 123 (H) 04/24/2022   Lab Results  Component Value Date   HAV REACTIVE (A) 05/08/2022   Lab Results  Component Value Date   HEPBSAG NON-REACTIVE 05/08/2022   HEPBSAB NON-REACTIVE 05/08/2022  No results found for: "HCVAB" Lab Results  Component Value Date   CHLAMYDIAWP Negative 05/08/2022   CHLAMYDIAWP Negative 05/08/2022   N Negative 05/08/2022   N Negative 05/08/2022   No results found for: "GCPROBEAPT" No results found for: "QUANTGOLD"  Assessment/Plan #HIV/Asymptomatic -CD4 760, VL ND, on 4/17 -Continue ART Plan -HIV labs -F/U in 6 months -Follow up in one month   #Depresion -F/U with PCP   #Tobaxxo dependence #Vaccination COVID 05/01/2021 Flu Monkeypox PCV 13 on 05/26/2018 Meningitis x2 dodese 2020 HepA-serology HEpB x3 dose seties(2015-216). Per records pt HBV core Ab +, sab neg, aAB neg,  Tdap 2020 Shingles 2020, 2020     #Elevated A1c 6.2 -Primary care visit on 6/4    #Health maintenance -Quantiferon-onegative -RPR PEN x 3 doses (initial RPR 1:1204), now 1:32 on 4/17 -HCV-Ab +, last VL ND. order today ab and VL -GC urine negaitve, oral and rectal today -Lipid-petavastain mg , will get labs and A1c at next visit -Dysplasia screen F/M-HSIL->anoscopy in 2023 repeat in 9 months. Refer to GI -Colonoscopy- A year ago    Danelle Earthly, MD [facility]   I have personally spent 38 minutes involved in face-to-face and non-face-to-face activities for this patient on the day of the visit. Professional time spent includes the following activities: Preparing to see the patient (review of tests), Obtaining and/or reviewing separately obtained history (admission/discharge record), Performing a medically appropriate examination and/or evaluation , Ordering medications/tests/procedures, referring and communicating with other health care professionals, Documenting clinical information in the EMR, Independently interpreting results (not separately reported), Communicating results to the patient/family/caregiver, Counseling and educating the patient/family/caregiver and Care coordination (not separately reported).

## 2022-06-11 DIAGNOSIS — Z8619 Personal history of other infectious and parasitic diseases: Secondary | ICD-10-CM | POA: Insufficient documentation

## 2022-06-11 DIAGNOSIS — F3340 Major depressive disorder, recurrent, in remission, unspecified: Secondary | ICD-10-CM | POA: Insufficient documentation

## 2022-06-11 DIAGNOSIS — I700 Atherosclerosis of aorta: Secondary | ICD-10-CM | POA: Insufficient documentation

## 2022-06-11 DIAGNOSIS — R7303 Prediabetes: Secondary | ICD-10-CM | POA: Insufficient documentation

## 2022-06-11 DIAGNOSIS — E785 Hyperlipidemia, unspecified: Secondary | ICD-10-CM | POA: Insufficient documentation

## 2022-06-11 LAB — COMPLETE METABOLIC PANEL WITH GFR
Albumin: 4.4 g/dL (ref 3.6–5.1)
CO2: 27 mmol/L (ref 20–32)
Calcium: 9.2 mg/dL (ref 8.6–10.3)
Glucose, Bld: 109 mg/dL — ABNORMAL HIGH (ref 65–99)
Sodium: 139 mmol/L (ref 135–146)
Total Protein: 7.3 g/dL (ref 6.1–8.1)

## 2022-06-13 DIAGNOSIS — F17201 Nicotine dependence, unspecified, in remission: Secondary | ICD-10-CM | POA: Insufficient documentation

## 2022-06-13 DIAGNOSIS — G4700 Insomnia, unspecified: Secondary | ICD-10-CM | POA: Insufficient documentation

## 2022-06-13 LAB — COMPLETE METABOLIC PANEL WITH GFR
AG Ratio: 1.5 (calc) (ref 1.0–2.5)
ALT: 19 U/L (ref 9–46)
AST: 19 U/L (ref 10–35)
Alkaline phosphatase (APISO): 52 U/L (ref 35–144)
BUN: 22 mg/dL (ref 7–25)
Chloride: 104 mmol/L (ref 98–110)
Creat: 1.32 mg/dL (ref 0.70–1.35)
Globulin: 2.9 g/dL (calc) (ref 1.9–3.7)
Potassium: 4.4 mmol/L (ref 3.5–5.3)
Total Bilirubin: 0.5 mg/dL (ref 0.2–1.2)
eGFR: 59 mL/min/{1.73_m2} — ABNORMAL LOW (ref >=60)

## 2022-06-13 LAB — CBC WITH DIFFERENTIAL/PLATELET
Absolute Monocytes: 408 cells/uL (ref 200–950)
Basophils Absolute: 48 cells/uL (ref 0–200)
Eosinophils Absolute: 120 cells/uL (ref 15–500)
HCT: 44.5 % (ref 38.5–50.0)
Hemoglobin: 15.2 g/dL (ref 13.2–17.1)
Lymphs Abs: 2280 cells/uL (ref 850–3900)
MCH: 32.3 pg (ref 27.0–33.0)
MCV: 94.5 fL (ref 80.0–100.0)
MPV: 9.6 fL (ref 7.5–12.5)
Monocytes Relative: 6.8 %
Total Lymphocyte: 38 %
WBC: 6 10*3/uL (ref 3.8–10.8)

## 2022-06-13 LAB — HIV-1 RNA QUANT-NO REFLEX-BLD
HIV 1 RNA Quant: NOT DETECTED Copies/mL
HIV-1 RNA Quant, Log: NOT DETECTED Log cps/mL

## 2022-06-13 LAB — CK: Total CK: 65 U/L (ref 44–196)

## 2022-06-17 ENCOUNTER — Other Ambulatory Visit: Payer: Self-pay

## 2022-06-18 ENCOUNTER — Other Ambulatory Visit (HOSPITAL_COMMUNITY): Payer: Self-pay

## 2022-06-24 DIAGNOSIS — R1310 Dysphagia, unspecified: Secondary | ICD-10-CM | POA: Insufficient documentation

## 2022-07-04 ENCOUNTER — Other Ambulatory Visit (HOSPITAL_COMMUNITY): Payer: Self-pay

## 2022-07-05 ENCOUNTER — Other Ambulatory Visit (HOSPITAL_COMMUNITY): Payer: Self-pay

## 2022-07-22 ENCOUNTER — Other Ambulatory Visit (HOSPITAL_BASED_OUTPATIENT_CLINIC_OR_DEPARTMENT_OTHER): Payer: Self-pay

## 2022-07-30 ENCOUNTER — Other Ambulatory Visit (HOSPITAL_COMMUNITY): Payer: Self-pay

## 2022-08-05 ENCOUNTER — Other Ambulatory Visit: Payer: Self-pay

## 2022-08-28 ENCOUNTER — Other Ambulatory Visit: Payer: Self-pay

## 2022-08-28 ENCOUNTER — Other Ambulatory Visit (HOSPITAL_COMMUNITY): Payer: Self-pay

## 2022-08-28 ENCOUNTER — Other Ambulatory Visit: Payer: Self-pay | Admitting: Pharmacist

## 2022-08-28 DIAGNOSIS — B20 Human immunodeficiency virus [HIV] disease: Secondary | ICD-10-CM

## 2022-08-28 MED ORDER — BIKTARVY 50-200-25 MG PO TABS
1.0000 | ORAL_TABLET | Freq: Every day | ORAL | 6 refills | Status: DC
Start: 2022-08-28 — End: 2023-01-06
  Filled 2022-08-28: qty 30, 30d supply, fill #0
  Filled 2022-09-24: qty 30, 30d supply, fill #1
  Filled 2022-10-22: qty 30, 30d supply, fill #2
  Filled 2022-11-26: qty 30, 30d supply, fill #3
  Filled 2022-12-27: qty 30, 30d supply, fill #4

## 2022-09-02 ENCOUNTER — Other Ambulatory Visit (HOSPITAL_COMMUNITY): Payer: Self-pay

## 2022-09-24 ENCOUNTER — Other Ambulatory Visit (HOSPITAL_COMMUNITY): Payer: Self-pay

## 2022-10-22 ENCOUNTER — Other Ambulatory Visit (HOSPITAL_COMMUNITY): Payer: Self-pay

## 2022-10-22 NOTE — Progress Notes (Signed)
Specialty Pharmacy Refill Coordination Note  Robert Crosby is a 67 y.o. male contacted today regarding refills of specialty medication(s) Bictegravir-Emtricitab-Tenofov   Patient requested Delivery   Delivery date: 11/05/22   Verified address: [address] [place] [address]   Medication will be filled on 11/04/22.

## 2022-10-22 NOTE — Progress Notes (Signed)
Specialty Pharmacy Ongoing Clinical Assessment Note  Robert Crosby is a 67 y.o. male who is being followed by the specialty pharmacy service for RxSp HIV   Patient's specialty medication(s) reviewed today: Bictegravir-Emtricitab-Tenofov   Missed doses in the last 4 weeks: 0   Patient/Caregiver did not have any additional questions or concerns.   Therapeutic benefit summary: Patient is achieving benefit   Adverse events/side effects summary: Experienced adverse events/side effects (weight gain, tolerable)   Patient's therapy is appropriate to: Continue    Goals Addressed             This Visit's Progress    Achieve Undetectable HIV Viral Load < 20       Patient is on track. Patient will maintain adherence.  Viral load was undetectable on 06/10/22.         Follow up:  6 months  Servando Snare Specialty Pharmacist

## 2022-11-21 ENCOUNTER — Other Ambulatory Visit: Payer: Self-pay

## 2022-11-25 ENCOUNTER — Other Ambulatory Visit: Payer: Self-pay

## 2022-11-26 ENCOUNTER — Other Ambulatory Visit: Payer: Self-pay

## 2022-11-26 NOTE — Progress Notes (Signed)
Specialty Pharmacy Refill Coordination Note  Pascal Dey is a 67 y.o. male contacted today regarding refills of specialty medication(s) Bictegravir-Emtricitab-Tenofov   Patient requested Delivery   Delivery date: 12/02/22   Verified address: [address] [name] [phone]   Medication will be filled on 12/01/22.

## 2022-11-29 ENCOUNTER — Other Ambulatory Visit (HOSPITAL_COMMUNITY): Payer: Self-pay

## 2022-11-29 ENCOUNTER — Other Ambulatory Visit: Payer: Self-pay | Admitting: Medical Genetics

## 2022-11-29 DIAGNOSIS — Z006 Encounter for examination for normal comparison and control in clinical research program: Secondary | ICD-10-CM

## 2022-12-01 DIAGNOSIS — K259 Gastric ulcer, unspecified as acute or chronic, without hemorrhage or perforation: Secondary | ICD-10-CM | POA: Insufficient documentation

## 2022-12-06 ENCOUNTER — Other Ambulatory Visit: Payer: Self-pay

## 2022-12-14 ENCOUNTER — Encounter (HOSPITAL_COMMUNITY): Payer: Self-pay | Admitting: Emergency Medicine

## 2022-12-14 ENCOUNTER — Emergency Department (HOSPITAL_COMMUNITY): Payer: Medicare HMO

## 2022-12-14 ENCOUNTER — Emergency Department (HOSPITAL_COMMUNITY)
Admission: EM | Admit: 2022-12-14 | Discharge: 2022-12-14 | Disposition: A | Discharge status: Home / Self Care | Payer: Medicare HMO | Attending: Emergency Medicine | Admitting: Emergency Medicine

## 2022-12-14 ENCOUNTER — Other Ambulatory Visit: Payer: Self-pay

## 2022-12-14 DIAGNOSIS — Z21 Asymptomatic human immunodeficiency virus [HIV] infection status: Secondary | ICD-10-CM | POA: Diagnosis not present

## 2022-12-14 DIAGNOSIS — R1011 Right upper quadrant pain: Secondary | ICD-10-CM | POA: Insufficient documentation

## 2022-12-14 DIAGNOSIS — R0602 Shortness of breath: Secondary | ICD-10-CM | POA: Insufficient documentation

## 2022-12-14 DIAGNOSIS — J449 Chronic obstructive pulmonary disease, unspecified: Secondary | ICD-10-CM | POA: Diagnosis not present

## 2022-12-14 DIAGNOSIS — Z79899 Other long term (current) drug therapy: Secondary | ICD-10-CM | POA: Diagnosis not present

## 2022-12-14 DIAGNOSIS — M546 Pain in thoracic spine: Secondary | ICD-10-CM | POA: Insufficient documentation

## 2022-12-14 DIAGNOSIS — R202 Paresthesia of skin: Secondary | ICD-10-CM | POA: Insufficient documentation

## 2022-12-14 DIAGNOSIS — R109 Unspecified abdominal pain: Secondary | ICD-10-CM

## 2022-12-14 LAB — CBC
HCT: 48.1 % (ref 39.0–52.0)
Hemoglobin: 16.4 g/dL (ref 13.0–17.0)
MCH: 32.2 pg (ref 26.0–34.0)
MCHC: 34.1 g/dL (ref 30.0–36.0)
MCV: 94.3 fL (ref 80.0–100.0)
Platelets: 295 10*3/uL (ref 150–400)
RBC: 5.1 MIL/uL (ref 4.22–5.81)
RDW: 14 % (ref 11.5–15.5)
WBC: 9 10*3/uL (ref 4.0–10.5)
nRBC: 0 % (ref 0.0–0.2)

## 2022-12-14 LAB — URINALYSIS, ROUTINE W REFLEX MICROSCOPIC
Bacteria, UA: NONE SEEN
Bilirubin Urine: NEGATIVE
Glucose, UA: NEGATIVE mg/dL
Hgb urine dipstick: NEGATIVE
Ketones, ur: 5 mg/dL — AB
Nitrite: NEGATIVE
Protein, ur: 30 mg/dL — AB
Specific Gravity, Urine: 1.03 (ref 1.005–1.030)
pH: 5 (ref 5.0–8.0)

## 2022-12-14 LAB — D-DIMER, QUANTITATIVE: D-Dimer, Quant: <0.27 ug{FEU}/mL (ref 0.00–0.50)

## 2022-12-14 LAB — TROPONIN I (HIGH SENSITIVITY)
Troponin I (High Sensitivity): 4 ng/L (ref <18)
Troponin I (High Sensitivity): 4 ng/L (ref <18)

## 2022-12-14 LAB — COMPREHENSIVE METABOLIC PANEL
ALT: 26 U/L (ref 0–44)
AST: 23 U/L (ref 15–41)
Albumin: 3.7 g/dL (ref 3.5–5.0)
Alkaline Phosphatase: 49 U/L (ref 38–126)
Anion gap: 13 (ref 5–15)
BUN: 21 mg/dL (ref 8–23)
CO2: 21 mmol/L — ABNORMAL LOW (ref 22–32)
Calcium: 9.1 mg/dL (ref 8.9–10.3)
Chloride: 104 mmol/L (ref 98–111)
Creatinine, Ser: 1.38 mg/dL — ABNORMAL HIGH (ref 0.61–1.24)
GFR, Estimated: 55 mL/min — ABNORMAL LOW (ref >60)
Glucose, Bld: 148 mg/dL — ABNORMAL HIGH (ref 70–99)
Potassium: 4.2 mmol/L (ref 3.5–5.1)
Sodium: 138 mmol/L (ref 135–145)
Total Bilirubin: 0.6 mg/dL (ref <1.2)
Total Protein: 7.3 g/dL (ref 6.5–8.1)

## 2022-12-14 LAB — LIPASE, BLOOD: Lipase: 78 U/L — ABNORMAL HIGH (ref 11–51)

## 2022-12-14 LAB — PROTIME-INR
INR: 1 (ref 0.8–1.2)
Prothrombin Time: 13.5 s (ref 11.4–15.2)

## 2022-12-14 LAB — CK: Total CK: 48 U/L — ABNORMAL LOW (ref 49–397)

## 2022-12-14 LAB — MAGNESIUM: Magnesium: 2.2 mg/dL (ref 1.7–2.4)

## 2022-12-14 MED ORDER — METHOCARBAMOL 500 MG PO TABS: 500.0000 mg | ORAL_TABLET | Freq: Three times a day (TID) | ORAL | 0 refills | Status: DC | PRN

## 2022-12-14 MED ORDER — IOHEXOL 350 MG/ML SOLN
75.0000 mL | Freq: Once | INTRAVENOUS | Status: AC | PRN
  Administered 2022-12-14: 75 mL via INTRAVENOUS

## 2022-12-14 MED ORDER — SODIUM CHLORIDE 0.9 % IV BOLUS
500.0000 mL | Freq: Once | INTRAVENOUS | Status: AC
  Administered 2022-12-14: 500 mL via INTRAVENOUS

## 2022-12-14 NOTE — ED Triage Notes (Signed)
Pt states had a liver biopsy on Nov 7th. Results came back normal per patient. States since then has been having right upper abdominal pain. Back pain worse with movement. Pt reports increased SOB over the last 4 days. Denies recent fever/cough. Denies CP.

## 2022-12-14 NOTE — Discharge Instructions (Addendum)
You have been seen in the Emergency Department (ED) for abdominal pain.  Your evaluation did not identify a clear cause of your symptoms but was generally reassuring.  Please follow up as instructed above regarding today's emergent visit and the symptoms that are bothering you.  Please call the spine doctor to discuss your chronic right leg numbness. Return if that suddenly worsens.   Return to the ED if your abdominal pain worsens or fails to improve, you develop bloody vomiting, bloody diarrhea, you are unable to tolerate fluids due to vomiting, fever greater than 101, or other symptoms that concern you.

## 2022-12-14 NOTE — ED Provider Notes (Signed)
Emergency Department Provider Note   I have reviewed the triage vital signs and the nursing notes.   HISTORY  Chief Complaint Abdominal Pain and Shortness of Breath   HPI Robert Crosby is a 67 y.o. male history of COPD, HIV, and hepatitis C successfully treated in 2015 status post liver biopsy on November 7 in the [facility].  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  He had a random liver biopsy on 11/7 for follow up.  He tells me since that time has had right upper quadrant abdominal pain radiating to his mid back.  No neck pain or headache.  No fevers.  No vomiting or diarrhea.  He is not having anterior chest pain although some of his back discomfort is in the mid to upper back area.  He feels weakness in both arms and numbness in the right leg which she reports has been the case for the past 2 months.  No known injury.  He is feeling some increased shortness of breath over the past 4 days.    Past Medical History:  Diagnosis Date   COPD (chronic obstructive pulmonary disease) (HCC)    HIV (human immunodeficiency virus infection) (HCC)     Review of Systems  Constitutional: No fever/chills. Positive weakness and muscle cramping.  Cardiovascular: Denies chest pain. Respiratory: Positive shortness of breath. Gastrointestinal: Positive RUQ abdominal pain.  No nausea, no vomiting. Genitourinary: Negative for dysuria. Musculoskeletal: Positive mid/upper back pain.  Skin: Negative for rash. Neurological: Negative for headaches. Positive right leg numbness x 2 months and subjective weakness in bilateral grips.    ____________________________________________   PHYSICAL EXAM:  VITAL SIGNS: ED Triage Vitals  Encounter Vitals Group     BP 12/14/22 1007 120/73     Pulse Rate 12/14/22 1007 86     Resp 12/14/22 1007 18     Temp 12/14/22 1007 (!) 97.5 F (36.4 C)     Temp Source 12/14/22 1007 Oral     SpO2 12/14/22 1007 98 %     Weight 12/14/22 1016 248 lb (112.5  kg)     Height 12/14/22 1016 5\' 10"  (1.778 m)   Constitutional: Alert and oriented. Well appearing and in no acute distress. Eyes: Conjunctivae are normal.  Head: Atraumatic. Nose: No congestion/rhinnorhea. Mouth/Throat: Mucous membranes are moist.  Neck: No stridor.   Cardiovascular: Normal rate, regular rhythm. Good peripheral circulation. Grossly normal heart sounds.   Respiratory: Normal respiratory effort.  No retractions. Lungs CTAB. Gastrointestinal: Soft with mild right sided tenderness. No peritonitis. Mild distention. Biopsy puncture site is well appearing without erythema, warmth, or drainage.  Musculoskeletal: No lower extremity tenderness nor edema. No gross deformities of extremities. Neurologic:  Normal speech and language. 5/5 strength in the bilateral upper extremities. Decreased sensation throughout in the RLE. Patient is ambulatory.  Skin:  Skin is warm, dry and intact. No rash noted  ____________________________________________   LABS (all labs ordered are listed, but only abnormal results are displayed)  Labs Reviewed  LIPASE, BLOOD - Abnormal; Notable for the following components:      Result Value   Lipase 78 (*)    All other components within normal limits  COMPREHENSIVE METABOLIC PANEL - Abnormal; Notable for the following components:   CO2 21 (*)    Glucose, Bld 148 (*)    Creatinine, Ser 1.38 (*)    GFR, Estimated 55 (*)    All other components within normal limits  URINALYSIS, ROUTINE W REFLEX MICROSCOPIC - Abnormal; Notable  for the following components:   Color, Urine AMBER (*)    APPearance HAZY (*)    Ketones, ur 5 (*)    Protein, ur 30 (*)    Leukocytes,Ua SMALL (*)    All other components within normal limits  CK - Abnormal; Notable for the following components:   Total CK 48 (*)    All other components within normal limits  CBC  PROTIME-INR  MAGNESIUM  D-DIMER, QUANTITATIVE  TROPONIN I (HIGH SENSITIVITY)  TROPONIN I (HIGH SENSITIVITY)    ____________________________________________  EKG   EKG Interpretation Date/Time:  Saturday December 14 2022 11:47:09 EST Ventricular Rate:  73 PR Interval:  196 QRS Duration:  76 QT Interval:  364 QTC Calculation: 402 R Axis:   61  Text Interpretation: Sinus rhythm Probable anteroseptal infarct, old Confirmed by Alona Bene [phone]) on 12/14/2022 11:48:45 AM        ____________________________________________  RADIOLOGY  DG Chest 2 View  Result Date: 12/14/2022 CLINICAL DATA:  Shortness of breath. Liver biopsy 1 month ago. Right upper quadrant abdominal pain since biopsy. EXAM: CHEST - 2 VIEW COMPARISON:  Radiographs 05/01/2022 and 11/03/2009. FINDINGS: The heart size and mediastinal contours are normal. The lungs are clear. There is no pleural effusion or pneumothorax. No acute osseous findings are identified. Mild degenerative changes in the spine. IMPRESSION: No evidence of acute cardiopulmonary process. Electronically Signed   By: Carey Bullocks M.D.   On: 12/14/2022 11:17    ____________________________________________   PROCEDURES  Procedure(s) performed:   Procedures  None ____________________________________________   INITIAL IMPRESSION / ASSESSMENT AND PLAN / ED COURSE  Pertinent labs & imaging results that were available during my care of the patient were reviewed by me and considered in my medical decision making (see chart for details).   This patient is Presenting for Evaluation of abdominal pain, which does require a range of treatment options, and is a complaint that involves a high risk of morbidity and mortality.  The Differential Diagnoses includes but is not exclusive to acute cholecystitis, intrathoracic causes for epigastric abdominal pain, gastritis, duodenitis, pancreatitis, small bowel or large bowel obstruction, abdominal aortic aneurysm, hernia, gastritis, etc.   Critical Interventions-    Medications  sodium chloride 0.9 % bolus 500  mL (0 mLs Intravenous Stopped 12/14/22 1540)  iohexol (OMNIPAQUE) 350 MG/ML injection 75 mL (75 mLs Intravenous Contrast Given 12/14/22 1513)    Reassessment after intervention: pain symptoms improved.   Clinical Laboratory Tests Ordered, included troponin and d dimer negative. No AKI. CK normal. No severe anemia.   Radiologic Tests Ordered, included CXR and CT chest, abd, pelvis. I independently interpreted the images and agree with radiology interpretation.   Cardiac Monitor Tracing which shows NSR.    Social Determinants of Health Risk patient is a smoker.   Medical Decision Making: Summary:  Patient with pain in the upper right abdomen since liver biopsy at an outside health system. No SIRS. Doubt sepsis. Given location near the chest, will also consider PE/ACS. Plan for imaging given recent instrumentation.   Reevaluation with update and discussion with patient. No acute findings on labs/CT. ? Radicular pain from spine. More chronic paresthesias noted in leg. Will give contact info for NSG follow up. No acute bony abnormality on CT of the spine.   Considered admission but ED workup is largely reassuring.   Patient's presentation is most consistent with acute presentation with potential threat to life or bodily function.   Disposition: discharge   ____________________________________________  FINAL  CLINICAL IMPRESSION(S) / ED DIAGNOSES  Final diagnoses:  Right flank pain  Right leg paresthesias     NEW OUTPATIENT MEDICATIONS STARTED DURING THIS VISIT:  Discharge Medication List as of 12/14/2022  4:03 PM     START taking these medications   Details  methocarbamol (ROBAXIN) 500 MG tablet Take 1 tablet (500 mg total) by mouth every 8 (eight) hours as needed for muscle spasms., Starting Sat 12/14/2022, Normal        Note:  This document was prepared using Dragon voice recognition software and may include unintentional dictation errors.  Alona Bene, MD, [facility]  [facility]    Jvon Meroney, Arlyss Repress, MD 12/16/22 [phone]

## 2022-12-23 ENCOUNTER — Ambulatory Visit: Payer: Medicare (Managed Care) | Admitting: Internal Medicine

## 2022-12-24 DIAGNOSIS — R918 Other nonspecific abnormal finding of lung field: Secondary | ICD-10-CM | POA: Insufficient documentation

## 2022-12-25 ENCOUNTER — Other Ambulatory Visit: Payer: Self-pay

## 2022-12-27 ENCOUNTER — Other Ambulatory Visit: Payer: Self-pay

## 2022-12-27 NOTE — Progress Notes (Signed)
Specialty Pharmacy Refill Coordination Note  Robert Crosby is a 67 y.o. male contacted today regarding refills of specialty medication(s) Bictegravir-Emtricitab-Tenofov [name])   Patient requested Delivery   Delivery date: 12/31/22   Verified address: [address] [place], [address]   Medication will be filled on 12/30/22.

## 2022-12-30 ENCOUNTER — Other Ambulatory Visit: Payer: Self-pay

## 2023-01-06 ENCOUNTER — Other Ambulatory Visit: Payer: Self-pay

## 2023-01-06 ENCOUNTER — Encounter: Payer: Self-pay | Admitting: Internal Medicine

## 2023-01-06 ENCOUNTER — Ambulatory Visit (INDEPENDENT_AMBULATORY_CARE_PROVIDER_SITE_OTHER): Payer: Medicare HMO | Admitting: Internal Medicine

## 2023-01-06 VITALS — BP 146/91 | HR 83 | Resp 16 | Ht 70.0 in | Wt 254.3 lb

## 2023-01-06 DIAGNOSIS — B20 Human immunodeficiency virus [HIV] disease: Secondary | ICD-10-CM

## 2023-01-06 MED ORDER — PITAVASTATIN CALCIUM 4 MG PO TABS
1.0000 | ORAL_TABLET | Freq: Every day | ORAL | 5 refills | Status: DC
  Filled 2023-01-06 – 2023-02-27 (×2): qty 30, 30d supply, fill #0
  Filled 2023-04-02: qty 30, 30d supply, fill #1

## 2023-01-06 MED ORDER — BIKTARVY 50-200-25 MG PO TABS
1.0000 | ORAL_TABLET | Freq: Every day | ORAL | 6 refills | Status: DC
  Filled 2023-01-06 – 2023-01-21 (×2): qty 30, 30d supply, fill #0
  Filled 2023-02-27 – 2023-02-28 (×2): qty 30, 30d supply, fill #1
  Filled 2023-03-28 – 2023-04-02 (×2): qty 30, 30d supply, fill #2
  Filled 2023-04-29: qty 30, 30d supply, fill #3
  Filled 2023-05-21 (×2): qty 30, 30d supply, fill #4
  Filled 2023-07-02: qty 30, 30d supply, fill #5
  Filled 2023-07-30: qty 30, 30d supply, fill #6

## 2023-01-06 NOTE — Progress Notes (Signed)
[facility]     HPI: Robert Crosby is a 67 y.o. male presents for HIV management. HPI: Followed by Robert Crosby of [place](left [facility] 1994) returned to [place] in 2024. 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  05/01/21: RPR 1:16->doxy x 1 weeks"didn't work"->[facility] 2023 Date of diagnosis: 2020 ART exposure: Biktarvy Past Ois: none Risk factors: MSM,  IVDA- remote in the 1960s(pt was HIV neative prior to 2020 testig) Partners in last 2months 0, in the last 12 months 0.  Anal sex receptive yes, insertive yes. Contraception some time Vaginal penile sex, contraception some times   Social: Occupation: Personnel officer Housing: house with neice Support: neice Understanding of HIV: good Etoh/drug/tobacco use: Social/prior drug use, last time used drugs was acid 2 years ago/quit 18 days aog Today 12/30: Discussed the use of AI scribe software for clinical note transcription with the patient, who gave verbal consent to proceed.  The patient, with a history of prediabetes, presents with significant weight gain over the past eight months. He reports an increase in his dietary intake, particularly snacking, despite attempts to control portion sizes and eliminate sugars from his diet. He attributes some of the weight gain to quitting smoking and moving to [place], where he began eating more due to living with his niece.   In addition, to his prediabetes and weight concerns, the patient also had a liver biospy. 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  He also had an anoscopyin August 2024, but the results of this procedure are not currently available.          Past Medical History:  Diagnosis Date   COPD (chronic obstructive pulmonary disease) (HCC)    HIV (human  immunodeficiency virus infection) (HCC)     Past Surgical History:  Procedure Laterality Date   APPENDECTOMY      History reviewed. No pertinent family history. Current Outpatient Medications on File Prior to Visit  Medication Sig Dispense Refill   albuterol (VENTOLIN HFA) 108 (90 Base) MCG/ACT inhaler Inhale 2 puffs into the lungs every 4 (four) hours as needed for wheezing or shortness of breath. 6.7 g 0   finasteride (PROSCAR) 5 MG tablet Take 5 mg by mouth daily.     INCRUSE ELLIPTA 62.5 MCG/ACT AEPB Inhale 1 puff into the lungs daily.     methocarbamol (ROBAXIN) 500 MG tablet Take 1 tablet (500 mg total) by mouth every 8 (eight) hours as needed for muscle spasms. 20 tablet 0   omeprazole (PRILOSEC) 40 MG capsule Take 40 mg by mouth daily.     zolpidem (AMBIEN) 5 MG tablet Take 5 mg by mouth at bedtime as needed.     azithromycin (ZITHROMAX) 250 MG tablet Take 1 tablet (250 mg total) by mouth daily. Starting tomorrow, take 1 tab every day until finished. (Patient not taking: Reported on 01/06/2023) 4 tablet 0   No current facility-administered medications on file prior to visit.    No Known Allergies    Lab Results HIV 1 RNA Quant (Copies/mL)  Date Value  06/10/2022 Not Detected  04/24/2022 Not Detected   CD4 T Cell Abs (/uL)  Date Value  04/24/2022 760   No results found for: "HIV1GENOSEQ" Lab Results  Component Value Date   WBC 9.0  12/14/2022   HGB 16.4 12/14/2022   HCT 48.1 12/14/2022   MCV 94.3 12/14/2022   PLT 295 12/14/2022    Lab Results  Component Value Date   CREATININE 1.38 (H) 12/14/2022   BUN 21 12/14/2022   NA 138 12/14/2022   K 4.2 12/14/2022   CL 104 12/14/2022   CO2 21 (L) 12/14/2022   Lab Results  Component Value Date   ALT 26 12/14/2022   AST 23 12/14/2022   ALKPHOS 49 12/14/2022   BILITOT 0.6 12/14/2022    Lab Results  Component Value Date   CHOL 196 04/24/2022   TRIG 178 (H) 04/24/2022   HDL 43 04/24/2022   LDLCALC 123 (H)  04/24/2022   Lab Results  Component Value Date   HAV REACTIVE (A) 05/08/2022   Lab Results  Component Value Date   HEPBSAG NON-REACTIVE 05/08/2022   HEPBSAB NON-REACTIVE 05/08/2022   No results found for: "HCVAB" Lab Results  Component Value Date   CHLAMYDIAWP Negative 05/08/2022   CHLAMYDIAWP Negative 05/08/2022   N Negative 05/08/2022   N Negative 05/08/2022   No results found for: "GCPROBEAPT" No results found for: "QUANTGOLD"  Assessment/Plan  #HIV/Asymptomatic -CD4 760 on 4/17, VL ND, on 6/30 -Continue BIktarvy Plan -HIV labs -F/U in 2 months. Considering injectibles    #Weight Gain Significant increase in weight over the past 8 months, likely secondary to dietary changes and increased snacking. Patient has attempted dietary modifications with limited success. -Plan to monitor weight closely and encourage continued dietary modifications and regular exercise. -See in 2 months to assses weight gain, I suspect it 2/2 lifestyle  chnages given he has been on biktarvy for years  #Prediabetes Followed by PCP  Hyperlipidemia Patient is on Pitavastatin, tolerating it well, and is compliant with medication. -Continue Pitavastatin and monitor lipid profile.     #Tobacco dependence #Vaccination COVID-UTD 9/24 Flu-UTD 9/24 Monkeypox PCV 13 on 05/26/2018 Meningitis x2 dodese 2020 HepA-serology HEpB x3 dose seties(2015-216). 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  Patient had an anoscopy in 2024 and a liver biopsy in 2024.  Records of these procedures are not available. -Obtain records from [facility] to review findings and recommendations.           Danelle Earthly, MD [facility]   I have personally spent 41 minutes involved in face-to-face and non-face-to-face activities for this patient on the day of the visit. Professional time spent includes the following activities: Preparing to see the patient (review of tests), Obtaining and/or reviewing separately obtained history (admission/discharge record), Performing a medically appropriate examination and/or evaluation , Ordering medications/tests/procedures, referring and communicating with other health care professionals, Documenting clinical information in the EMR, Independently interpreting results (not separately reported), Communicating results to the patient/family/caregiver, Counseling and educating the patient/family/caregiver and Care coordination (not separately reported).

## 2023-01-06 NOTE — Patient Instructions (Addendum)
YOUR PLAN:  -WEIGHT GAIN: You have experienced significant weight gain over the past eight months, likely due to dietary changes and increased snacking. We will monitor your weight closely and encourage you to continue with dietary modifications and regular exercise. We will also order labs to rule out other causes of weight gain.  -PREDIABETES: Prediabetes means your blood sugar levels are higher than normal but not high enough to be classified as diabetes. We will continue to monitor your blood glucose levels and encourage you to maintain dietary modifications and regular exercise to prevent progression to diabetes.  -HYPERLIPIDEMIA: Hyperlipidemia means you have high levels of fats (lipids) in your blood. You are currently taking Pitavastatin and tolerating it well. We will continue this medication and monitor your lipid profile.  -HIV: You are managing your HIV well with Biktarvy, and you have not missed any doses. We will continue this medication and monitor your viral load and CD4 count to ensure the treatment remains effective.   -IMMUNIZATIONS: You have received your flu and COVID vaccines.  INSTRUCTIONS:  We will follow up with lab results to rule out other causes of weight gain. Please continue with your current medications and lifestyle modifications. We will also obtain your gastrointestinal procedure records from the [facility] for further review.

## 2023-01-09 LAB — HIV-1 RNA QUANT-NO REFLEX-BLD
HIV 1 RNA Quant: NOT DETECTED {copies}/mL
HIV-1 RNA Quant, Log: NOT DETECTED {Log_copies}/mL

## 2023-01-09 LAB — CBC WITH DIFFERENTIAL/PLATELET
Absolute Lymphocytes: 2251 {cells}/uL (ref 850–3900)
Absolute Monocytes: 525 {cells}/uL (ref 200–950)
Basophils Absolute: 57 {cells}/uL (ref 0–200)
Basophils Relative: 0.8 %
Eosinophils Absolute: 206 {cells}/uL (ref 15–500)
Eosinophils Relative: 2.9 %
HCT: 46.2 % (ref 38.5–50.0)
Hemoglobin: 15.6 g/dL (ref 13.2–17.1)
MCH: 31.9 pg (ref 27.0–33.0)
MCHC: 33.8 g/dL (ref 32.0–36.0)
MCV: 94.5 fL (ref 80.0–100.0)
MPV: 9.6 fL (ref 7.5–12.5)
Monocytes Relative: 7.4 %
Neutro Abs: 4061 {cells}/uL (ref 1500–7800)
Neutrophils Relative %: 57.2 %
Platelets: 277 10*3/uL (ref 140–400)
RBC: 4.89 10*6/uL (ref 4.20–5.80)
RDW: 13.9 % (ref 11.0–15.0)
Total Lymphocyte: 31.7 %
WBC: 7.1 10*3/uL (ref 3.8–10.8)

## 2023-01-09 LAB — T PALLIDUM AB: T Pallidum Abs: POSITIVE — AB

## 2023-01-09 LAB — T-HELPER CELLS (CD4) COUNT (NOT AT ARMC)
Absolute CD4: 979 {cells}/uL (ref 490–1740)
CD4 T Helper %: 40 % (ref 30–61)
Total lymphocyte count: 2442 {cells}/uL (ref 850–3900)

## 2023-01-09 LAB — COMPLETE METABOLIC PANEL WITH GFR
AG Ratio: 1.3 (calc) (ref 1.0–2.5)
ALT: 26 U/L (ref 9–46)
AST: 20 U/L (ref 10–35)
Albumin: 4.3 g/dL (ref 3.6–5.1)
Alkaline phosphatase (APISO): 57 U/L (ref 35–144)
BUN/Creatinine Ratio: 22 (calc) (ref 6–22)
BUN: 26 mg/dL — ABNORMAL HIGH (ref 7–25)
CO2: 27 mmol/L (ref 20–32)
Calcium: 9.8 mg/dL (ref 8.6–10.3)
Chloride: 101 mmol/L (ref 98–110)
Creat: 1.17 mg/dL (ref 0.70–1.35)
Globulin: 3.2 g/dL (ref 1.9–3.7)
Glucose, Bld: 90 mg/dL (ref 65–99)
Potassium: 4.4 mmol/L (ref 3.5–5.3)
Sodium: 136 mmol/L (ref 135–146)
Total Bilirubin: 0.5 mg/dL (ref 0.2–1.2)
Total Protein: 7.5 g/dL (ref 6.1–8.1)
eGFR: 67 mL/min/{1.73_m2} (ref >=60)

## 2023-01-09 LAB — RPR TITER: RPR Titer: 1:8 {titer} — ABNORMAL HIGH

## 2023-01-09 LAB — RPR: RPR Ser Ql: REACTIVE — AB

## 2023-01-17 ENCOUNTER — Other Ambulatory Visit (HOSPITAL_COMMUNITY): Payer: Self-pay

## 2023-01-20 ENCOUNTER — Other Ambulatory Visit: Payer: Self-pay

## 2023-01-21 ENCOUNTER — Other Ambulatory Visit (HOSPITAL_COMMUNITY): Payer: Self-pay

## 2023-01-21 NOTE — Progress Notes (Signed)
 Specialty Pharmacy Refill Coordination Note  Robert Crosby is a 68 y.o. male contacted today regarding refills of specialty medication(s) Bictegravir-Emtricitab-Tenofov (Biktarvy )   Patient requested Delivery   Delivery date: 02/10/23   Verified address: [address], [place], [place] [address]   Medication will be filled on 02/07/23.

## 2023-02-07 ENCOUNTER — Other Ambulatory Visit: Payer: Self-pay

## 2023-02-27 ENCOUNTER — Other Ambulatory Visit: Payer: Self-pay

## 2023-02-27 ENCOUNTER — Other Ambulatory Visit (HOSPITAL_COMMUNITY): Payer: Self-pay

## 2023-02-28 ENCOUNTER — Other Ambulatory Visit: Payer: Self-pay

## 2023-02-28 NOTE — Progress Notes (Signed)
 Specialty Pharmacy Refill Coordination Note  Robert Crosby is a 68 y.o. male contacted today regarding refills of specialty medication(s) Bictegravir-Emtricitab-Tenofov [name])   Patient requested Delivery   Delivery date: 03/07/23   Verified address: [address] [place], [place] [address]   Medication will be filled on 03/06/23.

## 2023-03-05 ENCOUNTER — Ambulatory Visit: Payer: Medicare HMO | Admitting: Internal Medicine

## 2023-03-06 ENCOUNTER — Other Ambulatory Visit: Payer: Self-pay

## 2023-03-07 LAB — HM COLONOSCOPY

## 2023-03-28 ENCOUNTER — Other Ambulatory Visit: Payer: Self-pay

## 2023-04-02 ENCOUNTER — Other Ambulatory Visit: Payer: Self-pay

## 2023-04-02 NOTE — Progress Notes (Addendum)
 Specialty Pharmacy Refill Coordination Note  Robert Crosby is a 68 y.o. male contacted today regarding refills of specialty medication(s) Bictegravir-Emtricitab-Tenofov [name])   Patient requested Delivery   Delivery date: 04/11/23   Verified address: [address] [name] [address]   Medication will be filled on 04/10/23.     04/11/23: Spoke with patient regarding the need for PA for pitavastin. Patient states he has enogh medication to last a couple of weeks. We will ship when approved. Patient is aware that [name] will ship today for delivery 4/7.

## 2023-04-10 ENCOUNTER — Other Ambulatory Visit (HOSPITAL_COMMUNITY): Payer: Self-pay

## 2023-04-10 ENCOUNTER — Other Ambulatory Visit: Payer: Self-pay

## 2023-04-10 ENCOUNTER — Telehealth: Payer: Self-pay

## 2023-04-10 NOTE — Telephone Encounter (Signed)
 Submitted a Prior Authorization request to [facility] for Pitavastin via CoverMyMeds. Will update once we receive a response.    PA ID: [id number]

## 2023-04-11 ENCOUNTER — Other Ambulatory Visit: Payer: Self-pay

## 2023-04-11 ENCOUNTER — Other Ambulatory Visit (HOSPITAL_COMMUNITY): Payer: Self-pay

## 2023-04-11 ENCOUNTER — Telehealth: Payer: Self-pay

## 2023-04-11 NOTE — Telephone Encounter (Signed)
 Received a fax regarding Prior Authorization from [facility] for Pitavastatin Authorization has been DENIED because medication is not on there preferred list.  Zypitamag, Atorvastatin, Rosuvastatin, Pravastatin, Simvastatin, Lovastatin and Ezetimlbe  Tablets are Preferred.   PA Case ID: [id number] Phone# [phone]

## 2023-04-16 ENCOUNTER — Other Ambulatory Visit (HOSPITAL_COMMUNITY): Payer: Self-pay

## 2023-04-16 NOTE — Telephone Encounter (Signed)
 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

## 2023-04-17 ENCOUNTER — Encounter: Payer: Self-pay | Admitting: *Deleted

## 2023-04-17 ENCOUNTER — Ambulatory Visit: Admission: EM | Admit: 2023-04-17 | Discharge: 2023-04-17 | Disposition: A | Discharge status: Home / Self Care

## 2023-04-17 ENCOUNTER — Other Ambulatory Visit: Payer: Self-pay

## 2023-04-17 DIAGNOSIS — K047 Periapical abscess without sinus: Secondary | ICD-10-CM

## 2023-04-17 MED ORDER — CLINDAMYCIN HCL 300 MG PO CAPS: 300.0000 mg | ORAL_CAPSULE | Freq: Two times a day (BID) | ORAL | 0 refills | Status: AC

## 2023-04-17 NOTE — ED Triage Notes (Signed)
 Reports upper right filling replaced approx 1 month ago. Has had pain in right cheek intermittently. Has been on 2 rounds of amoxicillin since beginning March. Pain returned when antibiotics finished 4/8. Contacted dentist who was unable to see him

## 2023-04-17 NOTE — ED Provider Notes (Signed)
 [facility]    CSN: [id number] Arrival date & time: 04/17/23  1128      History   Chief Complaint Chief Complaint  Patient presents with   Dental Pain    HPI Robert Crosby is a 68 y.o. male.  Patient resents today with upper right sided jaw pain.  Recently had a dental procedure in which he had a filling replaced in a molar tooth.  Treated with one month of Amoxicillin, after taking last dose, pain developed.  He denies any fever or facial welling.  He has localized pain with palpation but no visible facial swelling.    Past Medical History:  Diagnosis Date   COPD (chronic obstructive pulmonary disease) (HCC)    HIV (human immunodeficiency virus infection) (HCC)     There are no active problems to display for this patient.   Past Surgical History:  Procedure Laterality Date   APPENDECTOMY         Home Medications    Prior to Admission medications   Medication Sig Start Date End Date Taking? Authorizing Provider  albuterol (VENTOLIN HFA) 108 (90 Base) MCG/ACT inhaler Inhale 2 puffs into the lungs every 4 (four) hours as needed for wheezing or shortness of breath. 05/01/22  Yes Theresia Lo, Turkey K, DO  bictegravir-emtricitabine-tenofovir AF (BIKTARVY) 50-200-25 MG TABS tablet Take 1 tablet by mouth daily. 01/06/23  Yes Danelle Earthly, MD  clindamycin (CLEOCIN) 300 MG capsule Take 1 capsule (300 mg total) by mouth 2 (two) times daily for 7 days. 04/17/23 04/24/23 Yes Bing Neighbors, NP  finasteride (PROSCAR) 5 MG tablet Take 5 mg by mouth daily. 01/09/22  Yes [provider]  INCRUSE ELLIPTA 62.5 MCG/ACT AEPB Inhale 1 puff into the lungs daily. 04/23/22  Yes [provider]  omeprazole (PRILOSEC) 40 MG capsule Take 40 mg by mouth daily. 03/14/22  Yes [provider]  Pitavastatin Calcium 4 MG TABS Take 1 tablet (4 mg total) by mouth daily. 01/06/23  Yes Danelle Earthly, MD  zolpidem (AMBIEN) 5 MG tablet Take 5 mg by mouth at bedtime  as needed. 04/04/22  Yes [provider]  azithromycin (ZITHROMAX) 250 MG tablet Take 1 tablet (250 mg total) by mouth daily. Starting tomorrow, take 1 tab every day until finished. Patient not taking: Reported on 01/06/2023 05/01/22   Elayne Snare K, DO  methocarbamol (ROBAXIN) 500 MG tablet Take 1 tablet (500 mg total) by mouth every 8 (eight) hours as needed for muscle spasms. Patient not taking: Reported on 04/17/2023 12/14/22   Long, Arlyss Repress, MD  SPIRIVA HANDIHALER 18 MCG inhalation capsule Place 18 mcg into inhaler and inhale daily. 03/03/23   [provider]    Family History History reviewed. No pertinent family history.  Social History Social History   Tobacco Use   Smoking status: Every Day    Types: Cigarettes   Smokeless tobacco: Never   Tobacco comments:    On a break  Substance Use Topics   Alcohol use: Yes   Drug use: Not Currently     Allergies   Patient has no known allergies.   Review of Systems Review of Systems Pertinent negatives listed in HPI  Physical Exam Triage Vital Signs ED Triage Vitals  Encounter Vitals Group     BP 04/17/23 1157 131/84     Systolic BP Percentile --      Diastolic BP Percentile --      Pulse Rate 04/17/23 1157 86     Resp 04/17/23  1157 18     Temp 04/17/23 1157 98.3 F (36.8 C)     Temp Source 04/17/23 1157 Oral     SpO2 04/17/23 1157 95 %     Weight --      Height --      Head Circumference --      Peak Flow --      Pain Score 04/17/23 1158 7     Pain Loc --      Pain Education --      Exclude from Growth Chart --    No data found.  Updated Vital Signs BP 131/84 (BP Location: Left Arm)   Pulse 86   Temp 98.3 F (36.8 C) (Oral)   Resp 18   SpO2 95%   Visual Acuity Right Eye Distance:   Left Eye Distance:   Bilateral Distance:    Right Eye Near:   Left Eye Near:    Bilateral Near:     Physical Exam   UC Treatments / Results  Labs (all labs ordered are listed, but only  abnormal results are displayed) Labs Reviewed - No data to display  EKG   Radiology No results found.  Procedures Procedures (including critical care time)  Medications Ordered in UC Medications - No data to display  Initial Impression / Assessment and Plan / UC Course  I have reviewed the triage vital signs and the nursing notes.  Pertinent labs & imaging results that were available during my care of the patient were reviewed by me and considered in my medical decision making (see chart for details).    Dental infection, treating with a 7-day course of clindamycin 300 mg twice daily. Patient encouraged to follow-up with dental provider if symptoms worsen or do not improve.  Okay to take ibuprofen or Tylenol as needed with clindamycin. Final Clinical Impressions(s) / UC Diagnoses   Final diagnoses:  Dental infection     Discharge Instructions      Entire course of medication.  Take medication with food.  Okay to take Tylenol or ibuprofen with medication if needed for pain.  So recommend encourage gargling with Listerine or any type of antimicrobial wash to help clear infection.  Low up with dental provider as needed.     ED Prescriptions     Medication Sig Dispense Auth. Provider   clindamycin (CLEOCIN) 300 MG capsule Take 1 capsule (300 mg total) by mouth 2 (two) times daily for 7 days. 14 capsule Bing Neighbors, NP      PDMP not reviewed this encounter.   Bing Neighbors, NP 04/17/23 1216

## 2023-04-17 NOTE — Discharge Instructions (Signed)
 Entire course of medication.  Take medication with food.  Okay to take Tylenol or ibuprofen with medication if needed for pain.  So recommend encourage gargling with Listerine or any type of antimicrobial wash to help clear infection.  Low up with dental provider as needed.

## 2023-04-22 ENCOUNTER — Other Ambulatory Visit (HOSPITAL_COMMUNITY): Payer: Self-pay

## 2023-04-22 ENCOUNTER — Encounter: Payer: Self-pay | Admitting: Internal Medicine

## 2023-04-22 ENCOUNTER — Other Ambulatory Visit: Payer: Self-pay

## 2023-04-22 ENCOUNTER — Ambulatory Visit: Payer: Medicare HMO | Admitting: Internal Medicine

## 2023-04-22 VITALS — BP 139/88 | HR 83 | Temp 98.0°F | Wt 250.0 lb

## 2023-04-22 DIAGNOSIS — B20 Human immunodeficiency virus [HIV] disease: Secondary | ICD-10-CM

## 2023-04-22 DIAGNOSIS — E785 Hyperlipidemia, unspecified: Secondary | ICD-10-CM

## 2023-04-22 DIAGNOSIS — F17200 Nicotine dependence, unspecified, uncomplicated: Secondary | ICD-10-CM | POA: Diagnosis not present

## 2023-04-22 MED ORDER — ATORVASTATIN CALCIUM 10 MG PO TABS: 10.0000 mg | ORAL_TABLET | Freq: Every day | ORAL | 5 refills | Status: DC

## 2023-04-22 NOTE — Progress Notes (Unsigned)
 [facility]     HPI: Robert Crosby is a 68 y.o. male presents for HIV management. HPI: Followed by [name] [place](left [place] 1994) returned to [place] in 2024. 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  05/01/21: RPR 1:16->doxy x 1 weeks"didn't work"->[facility] July 2023 Today 04/22/23: Placed  on clindamysin for dental infection, followed for dentistry.  No missed doses of biktarvy Date of diagnosis: 2020 ART exposure: Biktarvy Past Ois: none Risk factors: MSM,  IVDA- remote in the 1960s(pt was HIV neative prior to 2020 testig) Partners in last 2months 0, in the last 12 months 0.  Anal sex receptive yes, insertive yes. Contraception some time Vaginal penile sex, contraception some times   Social: Occupation: Personnel officer Housing: house with neice Support: neice Understanding of HIV: good Etoh/drug/tobacco use: Social/prior drug use, last time used drugs was acid 2 years ago/quit 18 days aog  Past Medical History:  Diagnosis Date   COPD (chronic obstructive pulmonary disease) (HCC)    HIV (human immunodeficiency virus infection) (HCC)     Past Surgical History:  Procedure Laterality Date   APPENDECTOMY      No family history on file. Current Outpatient Medications on File Prior to Visit  Medication Sig Dispense Refill   albuterol (VENTOLIN HFA) 108 (90 Base) MCG/ACT inhaler Inhale 2 puffs into the lungs every 4 (four) hours as needed for wheezing or shortness of breath. 6.7 g 0   bictegravir-emtricitabine-tenofovir AF (BIKTARVY) 50-200-25 MG TABS tablet Take 1 tablet by mouth daily. 30 tablet 6   clindamycin (CLEOCIN) 300 MG capsule Take 1 capsule (300 mg total) by mouth 2 (two) times daily for 7 days. 14 capsule 0   finasteride (PROSCAR) 5 MG tablet Take 5 mg by mouth daily.     INCRUSE ELLIPTA 62.5 MCG/ACT AEPB Inhale 1 puff  into the lungs daily.     omeprazole (PRILOSEC) 40 MG capsule Take 40 mg by mouth daily.     Pitavastatin Calcium 4 MG TABS Take 1 tablet (4 mg total) by mouth daily. 30 tablet 5   SPIRIVA HANDIHALER 18 MCG inhalation capsule Place 18 mcg into inhaler and inhale daily.     zolpidem (AMBIEN) 5 MG tablet Take 5 mg by mouth at bedtime as needed.     azithromycin (ZITHROMAX) 250 MG tablet Take 1 tablet (250 mg total) by mouth daily. Starting tomorrow, take 1 tab every day until finished. (Patient not taking: Reported on 04/22/2023) 4 tablet 0   methocarbamol (ROBAXIN) 500 MG tablet Take 1 tablet (500 mg total) by mouth every 8 (eight) hours as needed for muscle spasms. (Patient not taking: Reported on 04/17/2023) 20 tablet 0   No current facility-administered medications on file prior to visit.    No Known Allergies    Lab Results HIV 1 RNA Quant (Copies/mL)  Date Value  01/06/2023 Not Detected  06/10/2022 Not Detected  04/24/2022 Not Detected   CD4 T Cell Abs (/uL)  Date Value  04/24/2022 760   No results found for: "HIV1GENOSEQ" Lab Results  Component Value Date   WBC 7.1 01/06/2023   HGB 15.6 01/06/2023   HCT 46.2 01/06/2023   MCV 94.5 01/06/2023   PLT 277 01/06/2023    Lab Results  Component Value Date   CREATININE 1.17 01/06/2023   BUN 26 (H) 01/06/2023  NA 136 01/06/2023   K 4.4 01/06/2023   CL 101 01/06/2023   CO2 27 01/06/2023   Lab Results  Component Value Date   ALT 26 01/06/2023   AST 20 01/06/2023   ALKPHOS 49 12/14/2022   BILITOT 0.5 01/06/2023    Lab Results  Component Value Date   CHOL 196 04/24/2022   TRIG 178 (H) 04/24/2022   HDL 43 04/24/2022   LDLCALC 123 (H) 04/24/2022   Lab Results  Component Value Date   HAV REACTIVE (A) 05/08/2022   Lab Results  Component Value Date   HEPBSAG NON-REACTIVE 05/08/2022   HEPBSAB NON-REACTIVE 05/08/2022   No results found for: "HCVAB" Lab Results  Component Value Date   CHLAMYDIAWP Negative 05/08/2022    CHLAMYDIAWP Negative 05/08/2022   N Negative 05/08/2022   N Negative 05/08/2022   No results found for: "GCPROBEAPT" No results found for: "QUANTGOLD"  Assessment/Plan #HIV -VL ND on 01/06/23 -Continue BIktarvy Plan -HIV labs -F/U in 2 months for lfts as pt strted new statin.       #Weight -4lb weight loss since LV   #Prediabetes Followed by PCP   Hyperlipidemia Patient is on Pitavastatin, tolerating it well. Notes some myalgia with pituvastatin -Ck today -d/c pitavastain -start atorvastatin 10 mg       #Tobacco dependence #Vaccination COVID-UTD 9/24 Flu-UTD 9/24 Monkeypox PCV 13 on 05/26/2018 Meningitis x2 dodese 2020 HepA-immune HEpB x3 dose seties(2015-216). 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  Patient had an anoscopy in 2024 and a liver biopsy in 2024. Records of these procedures are not available. -Obtain records from [facility] to review findings and recommendations.   Orlie Bjornstad, MD [facility] I have personally spent 45 minutes involved in face-to-face and non-face-to-face activities for this patient on the day of the visit. Professional time spent includes the following activities: Preparing to see the patient (review of tests), Obtaining and/or reviewing separately obtained history (admission/discharge record), Performing a medically appropriate examination and/or evaluation , Ordering medications/tests/procedures, referring and communicating with  other health care professionals, Documenting clinical information in the EMR, Independently interpreting results (not separately reported), Communicating results to the patient/family/caregiver, Counseling and educating the patient/family/caregiver and Care coordination (not separately reported).

## 2023-04-23 LAB — T-HELPER CELLS (CD4) COUNT (NOT AT ARMC)
CD4 % Helper T Cell: 37 % (ref 33–65)
CD4 T Cell Abs: 756 /uL (ref 400–1790)

## 2023-04-24 LAB — CBC WITH DIFFERENTIAL/PLATELET
Absolute Lymphocytes: 2160 {cells}/uL (ref 850–3900)
Absolute Monocytes: 462 {cells}/uL (ref 200–950)
Basophils Absolute: 60 {cells}/uL (ref 0–200)
Basophils Relative: 1 %
Eosinophils Absolute: 132 {cells}/uL (ref 15–500)
Eosinophils Relative: 2.2 %
HCT: 46.9 % (ref 38.5–50.0)
Hemoglobin: 16 g/dL (ref 13.2–17.1)
MCH: 32.3 pg (ref 27.0–33.0)
MCHC: 34.1 g/dL (ref 32.0–36.0)
MCV: 94.7 fL (ref 80.0–100.0)
MPV: 9.6 fL (ref 7.5–12.5)
Monocytes Relative: 7.7 %
Neutro Abs: 3186 {cells}/uL (ref 1500–7800)
Neutrophils Relative %: 53.1 %
Platelets: 276 10*3/uL (ref 140–400)
RBC: 4.95 10*6/uL (ref 4.20–5.80)
RDW: 14 % (ref 11.0–15.0)
Total Lymphocyte: 36 %
WBC: 6 10*3/uL (ref 3.8–10.8)

## 2023-04-24 LAB — RPR TITER: RPR Titer: 1:8 {titer} — ABNORMAL HIGH

## 2023-04-24 LAB — COMPLETE METABOLIC PANEL WITHOUT GFR
AG Ratio: 1.2 (calc) (ref 1.0–2.5)
ALT: 35 U/L (ref 9–46)
AST: 33 U/L (ref 10–35)
Albumin: 4.1 g/dL (ref 3.6–5.1)
Alkaline phosphatase (APISO): 56 U/L (ref 35–144)
BUN: 21 mg/dL (ref 7–25)
CO2: 25 mmol/L (ref 20–32)
Calcium: 9.3 mg/dL (ref 8.6–10.3)
Chloride: 105 mmol/L (ref 98–110)
Creat: 1.14 mg/dL (ref 0.70–1.35)
Globulin: 3.4 g/dL (ref 1.9–3.7)
Glucose, Bld: 115 mg/dL — ABNORMAL HIGH (ref 65–99)
Potassium: 4.8 mmol/L (ref 3.5–5.3)
Sodium: 138 mmol/L (ref 135–146)
Total Bilirubin: 0.4 mg/dL (ref 0.2–1.2)
Total Protein: 7.5 g/dL (ref 6.1–8.1)

## 2023-04-24 LAB — LIPID PANEL
Cholesterol: 173 mg/dL (ref <200)
HDL: 48 mg/dL (ref >=40)
LDL Cholesterol (Calc): 97 mg/dL
Non-HDL Cholesterol (Calc): 125 mg/dL (ref <130)
Total CHOL/HDL Ratio: 3.6 (calc) (ref <5.0)
Triglycerides: 189 mg/dL — ABNORMAL HIGH (ref <150)

## 2023-04-24 LAB — HEPATITIS B SURFACE ANTIGEN: Hepatitis B Surface Ag: NONREACTIVE

## 2023-04-24 LAB — HEPATITIS B SURFACE ANTIBODY,QUALITATIVE: Hep B S Ab: REACTIVE — AB

## 2023-04-24 LAB — HIV-1 RNA QUANT-NO REFLEX-BLD
HIV 1 RNA Quant: NOT DETECTED {copies}/mL
HIV-1 RNA Quant, Log: NOT DETECTED {Log_copies}/mL

## 2023-04-24 LAB — RPR: RPR Ser Ql: REACTIVE — AB

## 2023-04-24 LAB — T PALLIDUM AB: T Pallidum Abs: POSITIVE — AB

## 2023-04-24 LAB — CK: Total CK: 50 U/L (ref 22–308)

## 2023-04-29 ENCOUNTER — Other Ambulatory Visit: Payer: Self-pay

## 2023-04-29 NOTE — Progress Notes (Signed)
 Specialty Pharmacy Ongoing Clinical Assessment Note  Robert Crosby is a 68 y.o. male who is being followed by the specialty pharmacy service for RxSp HIV   Patient's specialty medication(s) reviewed today: Bictegravir-Emtricitab-Tenofov (Biktarvy )   Missed doses in the last 4 weeks: 0   Patient/Caregiver did not have any additional questions or concerns.   Therapeutic benefit summary: Patient is achieving benefit   Adverse events/side effects summary: No adverse events/side effects   Patient's therapy is appropriate to: Continue    Goals Addressed             This Visit's Progress    Achieve Undetectable HIV Viral Load < 20       Patient is on track. Patient will maintain adherence.  Viral load remains undetectable long term.         Follow up:  6 months  Jaden Batchelder M Tilla Wilborn Specialty Pharmacist

## 2023-04-29 NOTE — Progress Notes (Signed)
 Specialty Pharmacy Refill Coordination Note  Robert Crosby is a 68 y.o. male contacted today regarding refills of specialty medication(s) Bictegravir-Emtricitab-Tenofov (Biktarvy )   Patient requested Delivery   Delivery date: 05/06/23   Verified address: [address]  [place] [address]   Medication will be filled on 05/05/23.

## 2023-05-21 ENCOUNTER — Other Ambulatory Visit: Payer: Self-pay

## 2023-05-21 ENCOUNTER — Other Ambulatory Visit: Payer: Self-pay | Admitting: Pharmacy Technician

## 2023-05-21 NOTE — Progress Notes (Signed)
 Specialty Pharmacy Refill Coordination Note  Robert Crosby is a 68 y.o. male contacted today regarding refills of specialty medication(s) Bictegravir-Emtricitab-Tenofov (Biktarvy )   Patient requested Delivery   Delivery date: 06/04/23   Verified address: [address] [place]   Medication will be filled on 06/03/23.

## 2023-06-16 DIAGNOSIS — M1811 Unilateral primary osteoarthritis of first carpometacarpal joint, right hand: Secondary | ICD-10-CM | POA: Insufficient documentation

## 2023-06-20 ENCOUNTER — Other Ambulatory Visit: Payer: Self-pay

## 2023-06-23 ENCOUNTER — Other Ambulatory Visit: Payer: Self-pay

## 2023-06-24 ENCOUNTER — Other Ambulatory Visit (HOSPITAL_COMMUNITY): Payer: Self-pay

## 2023-06-24 ENCOUNTER — Other Ambulatory Visit: Payer: Self-pay

## 2023-06-24 ENCOUNTER — Ambulatory Visit: Admitting: Internal Medicine

## 2023-06-24 ENCOUNTER — Encounter (HOSPITAL_COMMUNITY): Payer: Self-pay

## 2023-06-24 ENCOUNTER — Encounter: Payer: Self-pay | Admitting: Internal Medicine

## 2023-06-24 VITALS — BP 148/90 | HR 71 | Wt 242.0 lb

## 2023-06-24 DIAGNOSIS — B20 Human immunodeficiency virus [HIV] disease: Secondary | ICD-10-CM | POA: Diagnosis not present

## 2023-06-24 DIAGNOSIS — E785 Hyperlipidemia, unspecified: Secondary | ICD-10-CM

## 2023-06-24 LAB — COMPREHENSIVE METABOLIC PANEL WITH GFR
AG Ratio: 1.4 (calc) (ref 1.0–2.5)
ALT: 27 U/L (ref 9–46)
AST: 21 U/L (ref 10–35)
Albumin: 4.3 g/dL (ref 3.6–5.1)
Alkaline phosphatase (APISO): 57 U/L (ref 35–144)
BUN: 14 mg/dL (ref 7–25)
CO2: 26 mmol/L (ref 20–32)
Calcium: 9.3 mg/dL (ref 8.6–10.3)
Chloride: 104 mmol/L (ref 98–110)
Creat: 1.07 mg/dL (ref 0.70–1.35)
Globulin: 3 g/dL (ref 1.9–3.7)
Glucose, Bld: 96 mg/dL (ref 65–99)
Potassium: 4.6 mmol/L (ref 3.5–5.3)
Sodium: 137 mmol/L (ref 135–146)
Total Bilirubin: 0.5 mg/dL (ref 0.2–1.2)
Total Protein: 7.3 g/dL (ref 6.1–8.1)
eGFR: 75 mL/min/{1.73_m2} (ref >=60)

## 2023-06-24 NOTE — Progress Notes (Unsigned)
 [facility]     HPI: Robert Crosby is a 68 y.o. male presents for HIV management. Today @DATE @: Discussed the use of AI scribe software for clinical note transcription with the patient, who gave verbal consent to proceed.  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  Anal sex receptive***, insertive***. Contraception**** Oral sex, contraception*** Vaginal penile sex, contraception***  Social: Occupation: Housing: Support: Understanding of HIV: Etoh/drug/tobacco use:  Past Medical History:  Diagnosis Date   COPD (chronic obstructive pulmonary disease) (HCC)    HIV (human immunodeficiency virus infection) (HCC)     Past Surgical History:  Procedure Laterality Date   APPENDECTOMY      No family history on file. Current Outpatient Medications on File Prior to Visit  Medication Sig Dispense Refill   albuterol  (VENTOLIN  HFA) 108 (90 Base) MCG/ACT inhaler Inhale 2 puffs into the lungs every 4 (four) hours as needed for wheezing or shortness of breath. 6.7 g 0   atorvastatin  (LIPITOR) 10 MG tablet Take 1 tablet (10 mg total) by mouth daily. 30 tablet 5   bictegravir-emtricitabine -tenofovir  AF (BIKTARVY ) 50-200-25 MG TABS tablet Take 1 tablet by mouth daily. 30 tablet 6   finasteride (PROSCAR) 5 MG tablet Take 5 mg by mouth daily.     omeprazole (PRILOSEC) 40 MG capsule Take 40 mg by mouth daily.     SPIRIVA HANDIHALER 18 MCG inhalation capsule Place 18 mcg into inhaler and inhale daily.     zolpidem (AMBIEN) 5 MG tablet Take 5 mg by mouth at bedtime as needed.     azithromycin  (ZITHROMAX ) 250 MG tablet Take 1 tablet (250 mg total) by mouth daily. Starting tomorrow, take 1 tab every day until finished. (Patient not taking: Reported on 04/22/2023) 4 tablet 0   INCRUSE ELLIPTA 62.5 MCG/ACT AEPB Inhale 1 puff into the lungs  daily.     methocarbamol  (ROBAXIN ) 500 MG tablet Take 1 tablet (500 mg total) by mouth every 8 (eight) hours as needed for muscle spasms. (Patient not taking: Reported on 04/17/2023) 20 tablet 0   No current facility-administered medications on file prior to visit.    No Known Allergies    Lab Results HIV 1 RNA Quant  Date Value  04/22/2023 NOT DETECTED copies/mL  01/06/2023 Not Detected Copies/mL  06/10/2022 Not Detected Copies/mL   CD4 T Cell Abs (/uL)  Date Value  04/22/2023 756  04/24/2022 760   No results found for: HIV1GENOSEQ Lab Results  Component Value Date   WBC 6.0 04/22/2023   HGB 16.0 04/22/2023   HCT 46.9 04/22/2023   MCV 94.7 04/22/2023   PLT 276 04/22/2023    Lab Results  Component Value Date   CREATININE 1.14 04/22/2023   BUN 21 04/22/2023   NA 138 04/22/2023   K 4.8 04/22/2023   CL 105 04/22/2023   CO2 25 04/22/2023   Lab Results  Component Value Date   ALT 35 04/22/2023   AST 33 04/22/2023   ALKPHOS 49 12/14/2022   BILITOT 0.4 04/22/2023    Lab Results  Component Value Date   CHOL 173 04/22/2023   TRIG 189 (H) 04/22/2023   HDL 48 04/22/2023   LDLCALC 97 04/22/2023   Lab Results  Component Value Date   HAV REACTIVE (A) 05/08/2022   Lab Results  Component Value Date  HEPBSAG NON-REACTIVE 04/22/2023   HEPBSAB REACTIVE (A) 04/22/2023   No results found for: HCVAB Lab Results  Component Value Date   CHLAMYDIAWP Negative 05/08/2022   CHLAMYDIAWP Negative 05/08/2022   N Negative 05/08/2022   N Negative 05/08/2022   No results found for: GCPROBEAPT No results found for: QUANTGOLD  Assessment/Plan #HIV #HIV -VL ND on 01/06/23 -Continue BIktarvy  Plan -HIV labs -F/U in 2 months for lfts as pt started new statin.   - toelrating atorvatsin     #Weight -4lb weight loss since LV   #Prediabetes Followed by PCP   Hyperlipidemia Patient is on Pitavastatin , tolerating it well. Notes some myalgia with pituvastatin -Ck  today -d/c pitavastain -start atorvastatin  10 mg       #Tobacco dependence #Vaccination COVID-UTD 9/24 Flu-UTD 9/24 Monkeypox PCV 13 on 05/26/2018 Meningitis x2 dodese 2020 HepA-immune HEpB x3 dose seties(2015-216). 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  Patient had an anoscopy in 2024 and a liver biopsy in 2024. Records of these procedures are not available. -Obtain records from [facility] to review findings and recommendations.    Loney Stank, MD [facility]

## 2023-07-01 ENCOUNTER — Ambulatory Visit (INDEPENDENT_AMBULATORY_CARE_PROVIDER_SITE_OTHER): Payer: Self-pay | Admitting: Sports Medicine

## 2023-07-01 ENCOUNTER — Encounter: Payer: Self-pay | Admitting: Sports Medicine

## 2023-07-01 VITALS — BP 138/98 | HR 74 | Temp 97.5°F | Ht 70.0 in | Wt 243.6 lb

## 2023-07-01 DIAGNOSIS — J449 Chronic obstructive pulmonary disease, unspecified: Secondary | ICD-10-CM

## 2023-07-01 DIAGNOSIS — R1310 Dysphagia, unspecified: Secondary | ICD-10-CM | POA: Diagnosis not present

## 2023-07-01 DIAGNOSIS — F5101 Primary insomnia: Secondary | ICD-10-CM | POA: Diagnosis not present

## 2023-07-01 DIAGNOSIS — B20 Human immunodeficiency virus [HIV] disease: Secondary | ICD-10-CM

## 2023-07-01 DIAGNOSIS — I10 Essential (primary) hypertension: Secondary | ICD-10-CM | POA: Diagnosis not present

## 2023-07-01 DIAGNOSIS — E782 Mixed hyperlipidemia: Secondary | ICD-10-CM

## 2023-07-01 MED ORDER — ZOLPIDEM TARTRATE 5 MG PO TABS: 5.0000 mg | ORAL_TABLET | Freq: Every evening | ORAL | 0 refills | Status: DC | PRN

## 2023-07-01 MED ORDER — ATORVASTATIN CALCIUM 10 MG PO TABS: 5.0000 mg | ORAL_TABLET | Freq: Every day | ORAL | Status: DC

## 2023-07-01 NOTE — Progress Notes (Unsigned)
 Careteam: Patient Care Team: Sherlynn Madden, MD as PCP - General (Internal Medicine) Dennise Kingsley, MD as Consulting Physician (Infectious Diseases)  PLACE OF SERVICE:  [facility]  Advanced Directive information    No Known Allergies  Chief Complaint  Patient presents with   Establish Care    New patient  Patient needs Ambien refill  Pt has a scheduled surgery in Novemeber      Discussed the use of AI scribe software for clinical note transcription with the patient, who gave verbal consent to proceed.  History of Present Illness  The 10-year ASCVD risk score (Arnett DK, et al., 2019) is: 23.2%   Values used to calculate the score:     Age: 68 years     Clincally relevant sex: Male     Is Non-Hispanic African American: No     Diabetic: No     Tobacco smoker: Yes     Systolic Blood Pressure: 142 mmHg     Is BP treated: No     HDL Cholesterol: 48 mg/dL     Total Cholesterol: 173 mg/dL     Review of Systems:  Review of Systems  Constitutional:  Negative for chills and fever.  HENT:  Negative for congestion and sore throat.   Eyes:  Negative for double vision.  Respiratory:  Negative for cough, sputum production and shortness of breath.   Cardiovascular:  Negative for chest pain, palpitations and leg swelling.  Gastrointestinal:  Negative for abdominal pain, heartburn and nausea.  Genitourinary:  Negative for dysuria, frequency and hematuria.  Musculoskeletal:  Negative for falls and myalgias.  Neurological:  Negative for dizziness.  Psychiatric/Behavioral:  Negative for depression. The patient has insomnia.    Negative unless indicated in HPI.   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  He has a 7.5 pack-year smoking history. He has never used smokeless tobacco. He reports current alcohol use. He reports that he does not currently use drugs.  Family History  Problem Relation Age of Onset   Cancer Mother    Cancer Father     Medications: Patient's Medications  New Prescriptions   No medications on file  Previous Medications   ALBUTEROL  (VENTOLIN  HFA) 108 (90 BASE) MCG/ACT INHALER    Inhale 2 puffs into the lungs every 4 (four) hours as needed for wheezing or shortness of breath.   ATORVASTATIN  (LIPITOR) 10 MG TABLET    Take 1 tablet (10 mg total) by mouth daily.   AZITHROMYCIN  (ZITHROMAX ) 250 MG TABLET    Take 1 tablet (250 mg total) by mouth daily. Starting tomorrow, take 1 tab every day until finished.   BICTEGRAVIR-EMTRICITABINE -TENOFOVIR  AF (BIKTARVY ) 50-200-25 MG TABS TABLET    Take 1 tablet by mouth daily.   FINASTERIDE (PROSCAR) 5 MG TABLET    Take 5 mg by mouth daily.   INCRUSE ELLIPTA 62.5 MCG/ACT AEPB    Inhale 1 puff into the lungs daily.   METHOCARBAMOL  (ROBAXIN ) 500 MG TABLET    Take 1 tablet (500 mg total) by mouth every 8 (eight) hours as needed for muscle spasms.   OMEPRAZOLE (PRILOSEC) 40 MG CAPSULE    Take 40 mg by mouth daily.   SPIRIVA  HANDIHALER 18 MCG INHALATION CAPSULE    Place 18 mcg into inhaler and inhale daily.   TURMERIC (QC TUMERIC COMPLEX PO)    Take by mouth as needed.   ZOLPIDEM (AMBIEN) 5 MG TABLET    Take 5 mg by mouth at bedtime as needed.  Modified Medications   No medications on file  Discontinued Medications   No medications on file    Physical Exam: Vitals:   07/01/23 1052  BP: (!) 142/88  Pulse: 74  Temp: (!) 97.5 F (36.4 C)  TempSrc: Temporal  SpO2: 93%  Weight: 243 lb 9.6 oz (110.5 kg)  Height: 5' 10 (1.778 m)   Body mass index is 34.95 kg/m. BP Readings from Last 3 Encounters:  07/01/23 (!) 142/88  06/24/23 (!) 148/90  04/22/23 139/88   Wt Readings from Last 3 Encounters:  07/01/23 243 lb 9.6 oz (110.5 kg)   06/24/23 242 lb (109.8 kg)  04/22/23 250 lb (113.4 kg)    Physical Exam Constitutional:      Appearance: Normal appearance.  HENT:     Head: Normocephalic and atraumatic.   Cardiovascular:     Rate and Rhythm: Normal rate and regular rhythm.     Pulses: Normal pulses.     Heart sounds: Normal heart sounds.  Pulmonary:     Effort: No respiratory distress.     Breath sounds: No stridor. No wheezing or rales.  Abdominal:     General: Bowel sounds are normal. There is no distension.     Palpations: Abdomen is soft.     Tenderness: There is no abdominal tenderness. There is no guarding.   Musculoskeletal:        General: No swelling.   Neurological:     Mental Status: He is alert. Mental status is at baseline.     Motor: No weakness.     Labs reviewed: Basic Metabolic Panel: Recent Labs    12/14/22 1147 01/06/23 1049 04/22/23 1356 06/24/23 1112  NA  --  136 138 137  K  --  4.4 4.8 4.6  CL  --  101 105 104  CO2  --  27 25 26   GLUCOSE  --  90 115* 96  BUN  --  26* 21 14  CREATININE  --  1.17 1.14 1.07  CALCIUM   --  9.8 9.3 9.3  MG 2.2  --   --   --    Liver Function Tests: Recent Labs    12/14/22 1018 01/06/23 1049 04/22/23 1356 06/24/23 1112  AST 23 20 33 21  ALT 26 26 35 27  ALKPHOS 49  --   --   --   BILITOT 0.6 0.5 0.4 0.5  PROT 7.3 7.5 7.5 7.3  ALBUMIN 3.7  --   --   --    Recent Labs    12/14/22 1018  LIPASE 78*   No results for input(s): AMMONIA in the last 8760 hours. CBC: Recent Labs    12/14/22 1018 01/06/23 1049 04/22/23 1356  WBC 9.0 7.1 6.0  NEUTROABS  --  4,061 3,186  HGB 16.4 15.6 16.0  HCT 48.1 46.2 46.9  MCV 94.3 94.5 94.7  PLT 295 277 276   Lipid Panel: Recent Labs    04/22/23 1356  CHOL 173  HDL 48  LDLCALC 97  TRIG 189*  CHOLHDL 3.6   TSH: No results for input(s): TSH in the last 8760 hours. A1C: Lab Results  Component Value Date   HGBA1C 6.2 (H)  05/08/2022    Assessment and Plan Assessment &  Plan  1. Primary insomnia (Primary) Discussed regarding sleep hygiene Pt reports he takes ambien 1-2 tab/ month  Last refill about a year ago    2. Chronic obstructive pulmonary disease, unspecified COPD type (HCC)   3. Dysphagia, unspecified type   4. Human immunodeficiency virus (HIV) disease (HCC)   5. Primary hypertension   6. HIV disease (HCC)  Hld  - atorvastatin  (LIPITOR) 10 MG tablet; Take 0.5 tablets (5 mg total) by mouth daily.  Other orders - Turmeric (QC TUMERIC COMPLEX PO); Take by mouth as needed. - zolpidem (AMBIEN) 5 MG tablet; Take 1 tablet (5 mg total) by mouth at bedtime as needed.  Dispense: 15 tablet; Refill: 0      No follow-ups on file.:   Nyelli Samara

## 2023-07-02 ENCOUNTER — Other Ambulatory Visit: Payer: Self-pay

## 2023-07-02 NOTE — Progress Notes (Signed)
 Specialty Pharmacy Refill Coordination Note  Robert Crosby is a 68 y.o. male contacted today regarding refills of specialty medication(s) Bictegravir-Emtricitab-Tenofov (Biktarvy )   Patient requested Delivery   Delivery date: 07/04/23   Verified address: [address]  [place] [address]   Medication will be filled on 07/03/23.

## 2023-07-30 ENCOUNTER — Other Ambulatory Visit (HOSPITAL_COMMUNITY): Payer: Self-pay

## 2023-07-30 ENCOUNTER — Other Ambulatory Visit: Payer: Self-pay

## 2023-07-30 ENCOUNTER — Other Ambulatory Visit: Payer: Self-pay | Admitting: Pharmacy Technician

## 2023-07-30 NOTE — Progress Notes (Signed)
 Specialty Pharmacy Refill Coordination Note  Robert Crosby is a 68 y.o. male contacted today regarding refills of specialty medication(s) Bictegravir-Emtricitab-Tenofov (Biktarvy )   Patient requested Delivery   Delivery date: 08/01/23   Verified address: [address]   [place] [address]   Medication will be filled on 07/31/23.   Leave at office: [place] code is [id number]. Noted in [place] already.

## 2023-07-31 ENCOUNTER — Other Ambulatory Visit: Payer: Self-pay

## 2023-08-22 ENCOUNTER — Other Ambulatory Visit: Payer: Self-pay

## 2023-08-22 ENCOUNTER — Other Ambulatory Visit: Payer: Self-pay | Admitting: Internal Medicine

## 2023-08-22 DIAGNOSIS — B20 Human immunodeficiency virus [HIV] disease: Secondary | ICD-10-CM

## 2023-08-22 MED ORDER — BIKTARVY 50-200-25 MG PO TABS
1.0000 | ORAL_TABLET | Freq: Every day | ORAL | 3 refills | Status: DC
  Filled 2023-08-22 (×2): qty 30, 30d supply, fill #0
  Filled 2023-09-23 – 2023-09-25 (×2): qty 30, 30d supply, fill #1
  Filled 2023-10-21: qty 30, 30d supply, fill #2
  Filled 2023-11-20: qty 30, 30d supply, fill #3

## 2023-08-22 NOTE — Progress Notes (Signed)
 Specialty Pharmacy Refill Coordination Note  Robert Crosby is a 68 y.o. male contacted today regarding refills of specialty medication(s) Bictegravir-Emtricitab-Tenofov (Biktarvy )   Patient requested Delivery   Delivery date: 08/27/23   Verified address: [address], [place], [address]   Medication will be filled on 08/26/23, pending refill approval.     This fill date is pending response to refill request from provider. Patient is aware and if they have not received fill by intended date they must follow up with pharmacy.

## 2023-09-02 ENCOUNTER — Encounter: Payer: Self-pay | Admitting: Sports Medicine

## 2023-09-09 ENCOUNTER — Ambulatory Visit (INDEPENDENT_AMBULATORY_CARE_PROVIDER_SITE_OTHER): Admitting: Family

## 2023-09-09 ENCOUNTER — Encounter: Payer: Self-pay | Admitting: Family

## 2023-09-09 VITALS — BP 158/100 | HR 62 | Temp 97.8°F | Resp 20 | Ht 70.0 in | Wt 246.4 lb

## 2023-09-09 DIAGNOSIS — Z0000 Encounter for general adult medical examination without abnormal findings: Secondary | ICD-10-CM | POA: Diagnosis not present

## 2023-09-09 NOTE — Progress Notes (Signed)
 Subjective:   Robert Crosby is a 68 y.o. male who presents for Medicare Annual/Subsequent preventive examination.  Visit Complete: In person  Patient Medicare AWV questionnaire was completed by the patient on 09/09/2023; I have confirmed that all information answered by patient is correct and no changes since this date.  Cardiac Risk Factors include: advanced age (>67men, >58 women);male gender;smoking/ tobacco exposure;obesity (BMI >30kg/m2)     Objective:    Today's Vitals   09/09/23 1125 09/09/23 1132 09/09/23 1143  BP: (!) 160/120 (!) 158/100   Pulse: 62    Resp: 20    Temp: 97.8 F (36.6 C)    SpO2: 94%    Weight: 246 lb 6.4 oz (111.8 kg)    Height: 5' 10 (1.778 m)    PainSc:   8    Body mass index is 35.35 kg/m.     09/09/2023   11:18 AM 12/14/2022   10:20 AM 05/01/2022    8:49 AM  Advanced Directives  Does Patient Have a Medical Advance Directive? Yes No No  Type of Advance Directive Out of facility DNR (pink MOST or yellow form)    Does patient want to make changes to medical advance directive? No - Patient declined    Would patient like information on creating a medical advance directive?  No - Patient declined     Current Medications (verified) Outpatient Encounter Medications as of 09/09/2023  Medication Sig   albuterol  (VENTOLIN  HFA) 108 (90 Base) MCG/ACT inhaler Inhale 2 puffs into the lungs every 4 (four) hours as needed for wheezing or shortness of breath.   atorvastatin  (LIPITOR) 10 MG tablet Take 0.5 tablets (5 mg total) by mouth daily.   bictegravir-emtricitabine -tenofovir  AF (BIKTARVY ) 50-200-25 MG TABS tablet Take 1 tablet by mouth daily.   finasteride (PROSCAR) 5 MG tablet Take 5 mg by mouth daily.   omeprazole (PRILOSEC) 40 MG capsule Take 40 mg by mouth daily.   SPIRIVA HANDIHALER 18 MCG inhalation capsule Place 18 mcg into inhaler and inhale daily.   Turmeric (QC TUMERIC COMPLEX PO) Take by mouth as needed.   zolpidem  (AMBIEN ) 5 MG tablet Take 1  tablet (5 mg total) by mouth at bedtime as needed.   INCRUSE ELLIPTA 62.5 MCG/ACT AEPB Inhale 1 puff into the lungs daily. (Patient not taking: Reported on 09/09/2023)   No facility-administered encounter medications on file as of 09/09/2023.    Allergies (verified) Patient has no known allergies.   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   COPD (chronic obstructive pulmonary disease) (HCC)    HIV (human immunodeficiency virus infection) (HCC)    Past Surgical History:  Procedure Laterality Date   APPENDECTOMY     Family History  Problem Relation Age of Onset   Cancer Mother    Cancer Father    Social History   Socioeconomic History   Marital status: Single    Spouse name: Not on file   Number of children: Not on file   Years of education: Not on file   Highest education level: GED or equivalent  Occupational History   Not on file  Tobacco Use   Smoking status: Every Day    Current packs/day: 0.50    Average packs/day: 0.5 packs/day for 15.0 years (7.5 ttl pk-yrs)    Types: Cigarettes   Smokeless tobacco: Never   Tobacco comments:    On a break  Substance and Sexual Activity   Alcohol use: Yes  Drug use: Not Currently   Sexual activity: Not Currently    Birth control/protection: None  Other Topics Concern   Not on file  Social History Narrative   Not on file   Social Drivers of Health   Financial Resource Strain: Low Risk  (06/27/2023)   Overall Financial Resource Strain (CARDIA)    Difficulty of Paying Living Expenses: Not very hard  Food Insecurity: No Food Insecurity (09/09/2023)   Hunger Vital Sign    Worried About Running Out of Food in the Last Year: Never true    Ran Out of Food in the Last Year: Never true  Transportation Needs: No Transportation Needs (09/09/2023)   PRAPARE - Administrator, Civil Service (Medical): No    Lack of Transportation (Non-Medical): No  Physical Activity: Sufficiently Active  (06/27/2023)   Exercise Vital Sign    Days of Exercise per Week: 4 days    Minutes of Exercise per Session: 40 min  Stress: No Stress Concern Present (06/27/2023)   [employer] of Occupational Health - Occupational Stress Questionnaire    Feeling of Stress: Only a little  Social Connections: Moderately Isolated (06/27/2023)   Social Connection and Isolation Panel    Frequency of Communication with Friends and Family: More than three times a week    Frequency of Social Gatherings with Friends and Family: Three times a week    Attends Religious Services: 1 to 4 times per year    Active Member of Clubs or Organizations: No    Attends Engineer, structural: Not on file    Marital Status: Divorced    Tobacco Counseling Ready to quit: Not Answered Counseling given: Not Answered Tobacco comments: On a break   Clinical Intake:  Pre-visit preparation completed: No  Pain : 0-10 Pain Score: 8  Pain Type: Chronic pain Pain Location: Hip Pain Orientation: Right Pain Radiating Towards: No Pain Descriptors / Indicators: Aching Pain Onset: In the past 7 days Pain Frequency: Constant Pain Relieving Factors: Muscle relaxant Effect of Pain on Daily Activities: lifting leg  Pain Relieving Factors: Muscle relaxant  BMI - recorded: 35.35 Nutritional Status: BMI > 30  Obese Nutritional Risks: Non-healing wound Diabetes: No  How often do you need to have someone help you when you read instructions, pamphlets, or other written materials from your doctor or pharmacy?: 1 - Never What is the last grade level you completed in school?: 12 grade  Interpreter Needed?: No      Activities of Daily Living    09/09/2023   11:50 AM 09/08/2023   12:22 PM  In your present state of health, do you have any difficulty performing the following activities:  Hearing? 0 0  Vision? 0 0  Difficulty concentrating or making decisions? 0 0  Walking or climbing stairs? 0 0  Dressing or bathing? 0  0  Doing errands, shopping? 0 0  Preparing Food and eating ? N N  Using the Toilet? N N  In the past six months, have you accidently leaked urine? N N  Do you have problems with loss of bowel control? N N  Managing your Medications? N N  Managing your Finances? N N  Housekeeping or managing your Housekeeping? N N    Patient Care Team: Sherlynn Madden, MD as PCP - General (Internal Medicine) Dennise Kingsley, MD as Consulting Physician (Infectious Diseases) Ernesto Paulita SAILOR, MD as Referring Physician (Internal Medicine)  Indicate any recent Medical Services you may have received from other  than [facility] providers in the past year (date may be approximate).     Assessment:   This is a routine wellness examination for [facility].  Hearing/Vision screen Hearing Screening - Comments:: Some hearing issues. Vision Screening - Comments:: Eye exam feb 2025 no issues [facility] )   Goals Addressed             This Visit's Progress    Weight (lb) < 200 lb (90.7 kg)   246 lb 6.4 oz (111.8 kg)    Loss weight < 200 lbs        Depression Screen    09/09/2023   11:20 AM 07/01/2023   11:35 AM 06/24/2023   10:28 AM 01/06/2023   10:20 AM 06/10/2022   10:53 AM 05/08/2022    2:00 PM  PHQ 2/9 Scores  PHQ - 2 Score 0 0 0 0 0 0  PHQ- 9 Score   0       Fall Risk    09/09/2023   11:20 AM 09/08/2023   12:22 PM 07/01/2023   11:35 AM 06/24/2023   10:28 AM 01/06/2023   10:20 AM  Fall Risk   Falls in the past year? 0 1 0 0 0  Number falls in past yr: 0 0 0 0 0  Injury with Fall? 0 0 1 0 0  Risk for fall due to : No Fall Risks      Follow up Falls evaluation completed        MEDICARE RISK AT HOME: Medicare Risk at Home Any stairs in or around the home?: No If so, are there any without handrails?: No Home free of loose throw rugs in walkways, pet beds, electrical cords, etc?: Yes Adequate lighting in your home to reduce risk of falls?: Yes Life alert?: No Use of a cane, walker or w/c?:  No Grab bars in the bathroom?: Yes Shower chair or bench in shower?: No Elevated toilet seat or a handicapped toilet?: No  TIMED UP AND GO:  Was the test performed?  Yes  Length of time to ambulate 10 feet: 5 sec Gait steady and fast without use of assistive device    Cognitive Function:        09/09/2023   11:20 AM  6CIT Screen  What Year? 0 points  What month? 0 points  What time? 0 points  Count back from 20 0 points  Months in reverse 0 points  Repeat phrase 0 points  Total Score 0 points    Immunizations Immunization History  Administered Date(s) Administered    sv, Bivalent, Protein Subunit Rsvpref,pf (Abrysvo) 01/15/2023   Hep A / Hep B 12/20/2013, 07/04/2014   Hepatitis B, PED/ADOLESCENT 01/26/2014   Hepb-cpg 06/10/2022   Influenza-Unspecified 09/14/2022   Meningococcal polysaccharide vaccine (MPSV4) 02/24/2018, 05/26/2018   Moderna Sars-Cov-2 Peds vaccine 102yrs thru 40yrs 02/23/2019, 04/21/2019, 09/08/2019, 04/18/2020   Moderna Sars-Covid-2 Vaccination 02/23/2019, 04/21/2019, 09/08/2019, 04/18/2020   Pfizer Covid-19 Vaccine Bivalent Booster 87yrs & up 09/19/2020, 05/01/2021   Pfizer(Comirnaty)Fall Seasonal Vaccine 12 years and older 09/14/2022   Pneumococcal Conjugate-13 05/26/2018   Pneumococcal Polysaccharide-23 10/24/2015, 12/07/2017   Pneumococcal-Unspecified 01/08/2011   Td 03/22/2014   Tdap 02/24/2018   Vaccinia,smallpox Monkeypox Vaccine Live,pf 09/07/2020, 10/05/2020   Zoster Recombinant(Shingrix) 05/26/2018, 09/03/2018    TDAP status: Up to date  Flu Vaccine status: Due, Education has been provided regarding the importance of this vaccine. Advised may receive this vaccine at local pharmacy or Health Dept. Aware to provide a copy of  the vaccination record if obtained from local pharmacy or Health Dept. Verbalized acceptance and understanding.  Pneumococcal vaccine status: Due, Education has been provided regarding the importance of this vaccine.  Advised may receive this vaccine at local pharmacy or Health Dept. Aware to provide a copy of the vaccination record if obtained from local pharmacy or Health Dept. Verbalized acceptance and understanding.  Covid-19 vaccine status: Information provided on how to obtain vaccines.   Qualifies for Shingles Vaccine? Yes   Zostavax completed No   Shingrix Completed?: Yes  Screening Tests Health Maintenance  Topic Date Due   INFLUENZA VACCINE  10/09/2023 (Originally 08/08/2023)   COVID-19 Vaccine (10 - Moderna risk 2024-25 season) 10/09/2023 (Originally 09/08/2023)   Pneumococcal Vaccine: 50+ Years (4 of 4 - PCV20 or PCV21) 06/30/2024 (Originally 05/26/2023)   Medicare Annual Wellness (AWV)  09/08/2024   DTaP/Tdap/Td (3 - Td or Tdap) 02/25/2028   Colonoscopy  03/06/2033   Hepatitis C Screening  Completed   Zoster Vaccines- Shingrix  Completed   HPV VACCINES  Aged Out   Meningococcal B Vaccine  Aged Out   Lung Cancer Screening  Discontinued   Hepatitis B Vaccines 19-59 Average Risk  Discontinued    Health Maintenance  There are no preventive care reminders to display for this patient.   Colorectal cancer screening: Type of screening: Colonoscopy. Completed 03/07/2023. Repeat every 10 years  Lung Cancer Screening: (Low Dose CT Chest recommended if Age 33-80 years, 20 pack-year currently smoking OR have quit w/in 15years.) does qualify.   Lung Cancer Screening Referral: 12/14/2022  Additional Screening:  Hepatitis C Screening: does qualify; Completed yes  Vision Screening: Recommended annual ophthalmology exams for early detection of glaucoma and other disorders of the eye. Is the patient up to date with their annual eye exam?  Yes  Who is the provider or what is the name of the office in which the patient attends annual eye exams? [facility]  If pt is not established with a provider, would they like to be referred to a provider to establish care? No .   Dental Screening: Recommended  annual dental exams for proper oral hygiene  Diabetic Foot Exam: Diabetic Foot Exam: Completed N/A   Community Resource Referral / Chronic Care Management: CRR required this visit?  No   CCM required this visit?  No     Plan:     I have personally reviewed and noted the following in the patient's chart:   Medical and social history Use of alcohol, tobacco or illicit drugs  Current medications and supplements including opioid prescriptions. Patient is not currently taking opioid prescriptions. Functional ability and status Nutritional status Physical activity Advanced directives List of other physicians Hospitalizations, surgeries, and ER visits in previous 12 months Vitals Screenings to include cognitive, depression, and falls Referrals and appointments  In addition, I have reviewed and discussed with patient certain preventive protocols, quality metrics, and best practice recommendations. A written personalized care plan for preventive services as well as general preventive health recommendations were provided to patient.     Roxan JAYSON Plough, NP   09/09/2023   After Visit Summary: (In Person-Printed) AVS printed and given to the patient  Nurse Notes:Patient to get Influenza and COVID-19 vaccine at the pharmacy.

## 2023-09-09 NOTE — Patient Instructions (Signed)
 1.Go to local pharmacy to receive Flu and Covid vaccines.  [name] , Thank you for taking time to come for your Medicare Wellness Visit. I appreciate your ongoing commitment to your health goals. Please review the following plan we discussed and let me know if I can assist you in the future.   Screening recommendations/referrals: Colonoscopy : Up to date  Recommended yearly ophthalmology/optometry visit for glaucoma screening and checkup Recommended yearly dental visit for hygiene and checkup  Vaccinations: Influenza vaccine due annually in September/October Pneumococcal vaccine : Please obtain record for the last Pneumonia vaccine  Tdap vaccine : Up to date  Shingles vaccine : Up to date     Advanced directives: Yes   Conditions/risks identified:  advanced age (>90men, >51 women);male gender;smoking/ tobacco exposure;obesity (BMI >30kg/m2)  Next appointment: 1 year   Preventive Care 15 Years and Older, Male Preventive care refers to lifestyle choices and visits with your health care provider that can promote health and wellness. What does preventive care include? A yearly physical exam. This is also called an annual well check. Dental exams once or twice a year. Routine eye exams. Ask your health care provider how often you should have your eyes checked. Personal lifestyle choices, including: Daily care of your teeth and gums. Regular physical activity. Eating a healthy diet. Avoiding tobacco and drug use. Limiting alcohol use. Practicing safe sex. Taking low doses of aspirin every day. Taking vitamin and mineral supplements as recommended by your health care provider. What happens during an annual well check? The services and screenings done by your health care provider during your annual well check will depend on your age, overall health, lifestyle risk factors, and family history of disease. Counseling  Your health care provider may ask you questions about  your: Alcohol use. Tobacco use. Drug use. Emotional well-being. Home and relationship well-being. Sexual activity. Eating habits. History of falls. Memory and ability to understand (cognition). Work and work Astronomer. Screening  You may have the following tests or measurements: Height, weight, and BMI. Blood pressure. Lipid and cholesterol levels. These may be checked every 5 years, or more frequently if you are over 41 years old. Skin check. Lung cancer screening. You may have this screening every year starting at age 68 if you have a 30-pack-year history of smoking and currently smoke or have quit within the past 15 years. Fecal occult blood test (FOBT) of the stool. You may have this test every year starting at age 56. Flexible sigmoidoscopy or colonoscopy. You may have a sigmoidoscopy every 5 years or a colonoscopy every 10 years starting at age 39. Prostate cancer screening. Recommendations will vary depending on your family history and other risks. Hepatitis C blood test. Hepatitis B blood test. Sexually transmitted disease (STD) testing. Diabetes screening. This is done by checking your blood sugar (glucose) after you have not eaten for a while (fasting). You may have this done every 1-3 years. Abdominal aortic aneurysm (AAA) screening. You may need this if you are a current or former smoker. Osteoporosis. You may be screened starting at age 75 if you are at high risk. Talk with your health care provider about your test results, treatment options, and if necessary, the need for more tests. Vaccines  Your health care provider may recommend certain vaccines, such as: Influenza vaccine. This is recommended every year. Tetanus, diphtheria, and acellular pertussis (Tdap, Td) vaccine. You may need a Td booster every 10 years. Zoster vaccine. You may need this after age  60. Pneumococcal 13-valent conjugate (PCV13) vaccine. One dose is recommended after age 46. Pneumococcal  polysaccharide (PPSV23) vaccine. One dose is recommended after age 42. Talk to your health care provider about which screenings and vaccines you need and how often you need them. This information is not intended to replace advice given to you by your health care provider. Make sure you discuss any questions you have with your health care provider. Document Released: 01/20/2015 Document Revised: 09/13/2015 Document Reviewed: 10/25/2014 Elsevier Interactive Patient Education  2017 [employer].  Fall Prevention in the Home Falls can cause injuries. They can happen to people of all ages. There are many things you can do to make your home safe and to help prevent falls. What can I do on the outside of my home? Regularly fix the edges of walkways and driveways and fix any cracks. Remove anything that might make you trip as you walk through a door, such as a raised step or threshold. Trim any bushes or trees on the path to your home. Use bright outdoor lighting. Clear any walking paths of anything that might make someone trip, such as rocks or tools. Regularly check to see if handrails are loose or broken. Make sure that both sides of any steps have handrails. Any raised decks and porches should have guardrails on the edges. Have any leaves, snow, or ice cleared regularly. Use sand or salt on walking paths during winter. Clean up any spills in your garage right away. This includes oil or grease spills. What can I do in the bathroom? Use night lights. Install grab bars by the toilet and in the tub and shower. Do not use towel bars as grab bars. Use non-skid mats or decals in the tub or shower. If you need to sit down in the shower, use a plastic, non-slip stool. Keep the floor dry. Clean up any water that spills on the floor as soon as it happens. Remove soap buildup in the tub or shower regularly. Attach bath mats securely with double-sided non-slip rug tape. Do not have throw rugs and other  things on the floor that can make you trip. What can I do in the bedroom? Use night lights. Make sure that you have a light by your bed that is easy to reach. Do not use any sheets or blankets that are too big for your bed. They should not hang down onto the floor. Have a firm chair that has side arms. You can use this for support while you get dressed. Do not have throw rugs and other things on the floor that can make you trip. What can I do in the kitchen? Clean up any spills right away. Avoid walking on wet floors. Keep items that you use a lot in easy-to-reach places. If you need to reach something above you, use a strong step stool that has a grab bar. Keep electrical cords out of the way. Do not use floor polish or wax that makes floors slippery. If you must use wax, use non-skid floor wax. Do not have throw rugs and other things on the floor that can make you trip. What can I do with my stairs? Do not leave any items on the stairs. Make sure that there are handrails on both sides of the stairs and use them. Fix handrails that are broken or loose. Make sure that handrails are as long as the stairways. Check any carpeting to make sure that it is firmly attached to the stairs. Fix  any carpet that is loose or worn. Avoid having throw rugs at the top or bottom of the stairs. If you do have throw rugs, attach them to the floor with carpet tape. Make sure that you have a light switch at the top of the stairs and the bottom of the stairs. If you do not have them, ask someone to add them for you. What else can I do to help prevent falls? Wear shoes that: Do not have high heels. Have rubber bottoms. Are comfortable and fit you well. Are closed at the toe. Do not wear sandals. If you use a stepladder: Make sure that it is fully opened. Do not climb a closed stepladder. Make sure that both sides of the stepladder are locked into place. Ask someone to hold it for you, if possible. Clearly  mark and make sure that you can see: Any grab bars or handrails. First and last steps. Where the edge of each step is. Use tools that help you move around (mobility aids) if they are needed. These include: Canes. Walkers. Scooters. Crutches. Turn on the lights when you go into a dark area. Replace any light bulbs as soon as they burn out. Set up your furniture so you have a clear path. Avoid moving your furniture around. If any of your floors are uneven, fix them. If there are any pets around you, be aware of where they are. Review your medicines with your doctor. Some medicines can make you feel dizzy. This can increase your chance of falling. Ask your doctor what other things that you can do to help prevent falls. This information is not intended to replace advice given to you by your health care provider. Make sure you discuss any questions you have with your health care provider. Document Released: 10/20/2008 Document Revised: 06/01/2015 Document Reviewed: 01/28/2014 Elsevier Interactive Patient Education  2017 [employer].

## 2023-09-15 ENCOUNTER — Ambulatory Visit: Payer: Self-pay

## 2023-09-15 ENCOUNTER — Encounter (HOSPITAL_COMMUNITY): Payer: Self-pay | Admitting: Internal Medicine

## 2023-09-15 ENCOUNTER — Emergency Department (HOSPITAL_COMMUNITY)

## 2023-09-15 ENCOUNTER — Observation Stay (HOSPITAL_COMMUNITY)
Admission: EM | Admit: 2023-09-15 | Discharge: 2023-09-17 | Disposition: A | Discharge status: Home / Self Care | Attending: Internal Medicine | Admitting: Internal Medicine

## 2023-09-15 ENCOUNTER — Inpatient Hospital Stay (HOSPITAL_COMMUNITY)

## 2023-09-15 DIAGNOSIS — Z79899 Other long term (current) drug therapy: Secondary | ICD-10-CM | POA: Insufficient documentation

## 2023-09-15 DIAGNOSIS — R0602 Shortness of breath: Secondary | ICD-10-CM | POA: Insufficient documentation

## 2023-09-15 DIAGNOSIS — J449 Chronic obstructive pulmonary disease, unspecified: Secondary | ICD-10-CM | POA: Diagnosis not present

## 2023-09-15 DIAGNOSIS — Z6835 Body mass index (BMI) 35.0-35.9, adult: Secondary | ICD-10-CM | POA: Insufficient documentation

## 2023-09-15 DIAGNOSIS — R079 Chest pain, unspecified: Secondary | ICD-10-CM | POA: Diagnosis not present

## 2023-09-15 DIAGNOSIS — E785 Hyperlipidemia, unspecified: Secondary | ICD-10-CM | POA: Diagnosis not present

## 2023-09-15 DIAGNOSIS — F1090 Alcohol use, unspecified, uncomplicated: Secondary | ICD-10-CM | POA: Insufficient documentation

## 2023-09-15 DIAGNOSIS — R0789 Other chest pain: Secondary | ICD-10-CM | POA: Diagnosis not present

## 2023-09-15 DIAGNOSIS — Z1152 Encounter for screening for COVID-19: Secondary | ICD-10-CM | POA: Diagnosis not present

## 2023-09-15 DIAGNOSIS — I10 Essential (primary) hypertension: Secondary | ICD-10-CM

## 2023-09-15 DIAGNOSIS — B20 Human immunodeficiency virus [HIV] disease: Secondary | ICD-10-CM | POA: Diagnosis not present

## 2023-09-15 DIAGNOSIS — F17210 Nicotine dependence, cigarettes, uncomplicated: Secondary | ICD-10-CM | POA: Diagnosis not present

## 2023-09-15 DIAGNOSIS — R42 Dizziness and giddiness: Secondary | ICD-10-CM | POA: Diagnosis not present

## 2023-09-15 DIAGNOSIS — Z7901 Long term (current) use of anticoagulants: Secondary | ICD-10-CM | POA: Insufficient documentation

## 2023-09-15 DIAGNOSIS — E66812 Obesity, class 2: Secondary | ICD-10-CM | POA: Insufficient documentation

## 2023-09-15 LAB — HEPATIC FUNCTION PANEL
ALT: 24 U/L (ref 0–44)
AST: 21 U/L (ref 15–41)
Albumin: 3.6 g/dL (ref 3.5–5.0)
Alkaline Phosphatase: 53 U/L (ref 38–126)
Bilirubin, Direct: <0.1 mg/dL (ref 0.0–0.2)
Total Bilirubin: 0.4 mg/dL (ref 0.0–1.2)
Total Protein: 7.2 g/dL (ref 6.5–8.1)

## 2023-09-15 LAB — BASIC METABOLIC PANEL WITH GFR
Anion gap: 15 (ref 5–15)
BUN: 18 mg/dL (ref 8–23)
CO2: 19 mmol/L — ABNORMAL LOW (ref 22–32)
Calcium: 8.8 mg/dL — ABNORMAL LOW (ref 8.9–10.3)
Chloride: 104 mmol/L (ref 98–111)
Creatinine, Ser: 1.14 mg/dL (ref 0.61–1.24)
GFR, Estimated: >60 mL/min (ref >60)
Glucose, Bld: 144 mg/dL — ABNORMAL HIGH (ref 70–99)
Potassium: 4.2 mmol/L (ref 3.5–5.1)
Sodium: 138 mmol/L (ref 135–145)

## 2023-09-15 LAB — CBC
HCT: 47.8 % (ref 39.0–52.0)
Hemoglobin: 16.1 g/dL (ref 13.0–17.0)
MCH: 32.7 pg (ref 26.0–34.0)
MCHC: 33.7 g/dL (ref 30.0–36.0)
MCV: 97 fL (ref 80.0–100.0)
Platelets: 250 K/uL (ref 150–400)
RBC: 4.93 MIL/uL (ref 4.22–5.81)
RDW: 14.1 % (ref 11.5–15.5)
WBC: 7.1 K/uL (ref 4.0–10.5)
nRBC: 0 % (ref 0.0–0.2)

## 2023-09-15 LAB — LIPID PANEL
Cholesterol: 195 mg/dL (ref 0–200)
HDL: 36 mg/dL — ABNORMAL LOW (ref >40)
LDL Cholesterol: 112 mg/dL — ABNORMAL HIGH (ref 0–99)
Total CHOL/HDL Ratio: 5.4 ratio
Triglycerides: 235 mg/dL — ABNORMAL HIGH (ref <150)
VLDL: 47 mg/dL — ABNORMAL HIGH (ref 0–40)

## 2023-09-15 LAB — TROPONIN I (HIGH SENSITIVITY)
Troponin I (High Sensitivity): 6 ng/L (ref <18)
Troponin I (High Sensitivity): 5 ng/L (ref <18)

## 2023-09-15 LAB — HEMOGLOBIN A1C
Hgb A1c MFr Bld: 5.8 % — ABNORMAL HIGH (ref 4.8–5.6)
Mean Plasma Glucose: 119.76 mg/dL

## 2023-09-15 LAB — TSH: TSH: 1.706 u[IU]/mL (ref 0.350–4.500)

## 2023-09-15 LAB — RESP PANEL BY RT-PCR (RSV, FLU A&B, COVID)  RVPGX2
Influenza A by PCR: NEGATIVE
Influenza B by PCR: NEGATIVE
Resp Syncytial Virus by PCR: NEGATIVE
SARS Coronavirus 2 by RT PCR: NEGATIVE

## 2023-09-15 LAB — AMMONIA: Ammonia: 18 umol/L (ref 9–35)

## 2023-09-15 MED ORDER — HYDRALAZINE HCL 20 MG/ML IJ SOLN: 10.0000 mg | Freq: Four times a day (QID) | INTRAMUSCULAR | Status: DC | PRN

## 2023-09-15 MED ORDER — IOHEXOL 350 MG/ML SOLN
100.0000 mL | Freq: Once | INTRAVENOUS | Status: AC | PRN
Start: 2023-09-15 — End: 2023-09-15
  Administered 2023-09-15: 100 mL via INTRAVENOUS

## 2023-09-15 MED ORDER — KETOROLAC TROMETHAMINE 30 MG/ML IJ SOLN
30.0000 mg | Freq: Once | INTRAMUSCULAR | Status: AC
  Administered 2023-09-15: 30 mg via INTRAVENOUS
  Filled 2023-09-15: qty 1

## 2023-09-15 MED ORDER — MORPHINE SULFATE (PF) 2 MG/ML IV SOLN: 1.0000 mg | INTRAVENOUS | Status: DC | PRN

## 2023-09-15 MED ORDER — NITROGLYCERIN 0.4 MG SL SUBL
0.4000 mg | SUBLINGUAL_TABLET | SUBLINGUAL | Status: DC | PRN
  Administered 2023-09-15: 0.4 mg via SUBLINGUAL
  Filled 2023-09-15: qty 1

## 2023-09-15 MED ORDER — ATORVASTATIN CALCIUM 10 MG PO TABS
5.0000 mg | ORAL_TABLET | Freq: Every day | ORAL | Status: DC
Start: 2023-09-15 — End: 2023-09-16
  Administered 2023-09-15 – 2023-09-16 (×2): 5 mg via ORAL
  Filled 2023-09-15 (×2): qty 1

## 2023-09-15 MED ORDER — BICTEGRAVIR-EMTRICITAB-TENOFOV 50-200-25 MG PO TABS
1.0000 | ORAL_TABLET | Freq: Every day | ORAL | Status: DC
  Administered 2023-09-15 – 2023-09-17 (×2): 1 via ORAL
  Filled 2023-09-15 (×3): qty 1

## 2023-09-15 MED ORDER — ALBUTEROL SULFATE (2.5 MG/3ML) 0.083% IN NEBU: 2.5000 mg | INHALATION_SOLUTION | Freq: Four times a day (QID) | RESPIRATORY_TRACT | Status: DC | PRN

## 2023-09-15 MED ORDER — FINASTERIDE 5 MG PO TABS
5.0000 mg | ORAL_TABLET | Freq: Every day | ORAL | Status: DC
  Administered 2023-09-15 – 2023-09-17 (×3): 5 mg via ORAL
  Filled 2023-09-15 (×3): qty 1

## 2023-09-15 MED ORDER — LORAZEPAM 2 MG/ML IJ SOLN
1.0000 mg | Freq: Once | INTRAMUSCULAR | Status: AC
  Administered 2023-09-15: 1 mg via INTRAVENOUS
  Filled 2023-09-15: qty 1

## 2023-09-15 MED ORDER — NICOTINE 21 MG/24HR TD PT24
21.0000 mg | MEDICATED_PATCH | Freq: Every day | TRANSDERMAL | Status: DC
  Administered 2023-09-15 – 2023-09-17 (×3): 21 mg via TRANSDERMAL
  Filled 2023-09-15 (×3): qty 1

## 2023-09-15 MED ORDER — ACETAMINOPHEN 325 MG PO TABS
650.0000 mg | ORAL_TABLET | ORAL | Status: DC | PRN
  Administered 2023-09-15 – 2023-09-16 (×3): 650 mg via ORAL
  Filled 2023-09-15 (×3): qty 2

## 2023-09-15 MED ORDER — AMLODIPINE BESYLATE 5 MG PO TABS
5.0000 mg | ORAL_TABLET | Freq: Every day | ORAL | Status: DC
  Administered 2023-09-15 – 2023-09-17 (×3): 5 mg via ORAL
  Filled 2023-09-15 (×3): qty 1

## 2023-09-15 MED ORDER — PANTOPRAZOLE SODIUM 40 MG IV SOLR
40.0000 mg | Freq: Two times a day (BID) | INTRAVENOUS | Status: DC
  Administered 2023-09-15 – 2023-09-17 (×5): 40 mg via INTRAVENOUS
  Filled 2023-09-15 (×5): qty 10

## 2023-09-15 MED ORDER — ONDANSETRON HCL 4 MG/2ML IJ SOLN: 4.0000 mg | Freq: Four times a day (QID) | INTRAMUSCULAR | Status: DC | PRN

## 2023-09-15 MED ORDER — UMECLIDINIUM BROMIDE 62.5 MCG/ACT IN AEPB
1.0000 | INHALATION_SPRAY | Freq: Every day | RESPIRATORY_TRACT | Status: DC
  Filled 2023-09-15: qty 7

## 2023-09-15 NOTE — ED Notes (Signed)
 Called [facility]

## 2023-09-15 NOTE — Consult Note (Addendum)
 Cardiology Consultation   Patient ID: Robert Crosby MRN: [id number]; DOB: 11-15-1955  Admit date: 09/15/2023 Date of Consult: 09/15/2023  PCP:  Sherlynn Madden, MD   [facility] Providers Cardiologist:  None   new     Patient Profile: Robert Crosby is a 68 y.o. male with a hx of pre-diabetes, well controlled HIV on Biktarvy , ongoing tobacco abuse and COPD, occasional alcohol, obesity with a BMI of 35.01 BPH, history of gastric ulcer, history of hepatitis C virus, history of syphilis, history of pulmonary nodules, who is being seen 09/15/2023 for the evaluation of chest pain at the request of Dr Tobie.  History of Present Illness: Robert Crosby came to the ER today with HA>> then CP associated w/ SOB, Nausea, L arm numbness. Sx were intense and concerning for dissection >> CTA for dissection performed and was negative, with no other acute process. Cards asked to see.   Robert Crosby does not normally get any chest pain.  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  He does not get chest pain with this.  During the COVID period, he gained quite a bit of weight, in the last 5 years he has gained approximately 60 pounds.  He said he has been unable to lose the weight.  He is also smoking, after having quit for several years.  He has chronic wheezing and shortness of breath from the COPD and has upper airway wheeze as well.  He requires albuterol  as needed, more during some periods of time than others.  Recently, he has been using albuterol  pretty regularly and feels that his normal dyspnea on exertion has been no worse than usual.  His blood pressure has been significantly elevated recently, he just started checking it.  Prior to his weight gain, his blood pressure ran on the low side.  For the last 3 days, he has been feeling lightheaded when he gets out of bed in the morning.  Once he got a blood  pressure cuff and started checking it, he noted his blood pressure to be very high in the mornings.  Today, he got up feeling lightheaded as per usual.  He was a little nauseated so only took his Biktarvy .  He checked his blood pressure and it was 176/119.  Then the chest pain started it was squeezing, tight and pressure.  There is to none/10.  It did not change much with deep inspiration or coughing.  He did not take any medications for it.  In the emergency room, he finally got some nitroglycerin  which he says helped a little.  The pain has gradually improved, but is still there.  He has nerve endings that are causing him to jump when you touch the side of his neck and parts of his chest.  This is new and different.  It is involuntary.   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 Authorizing Provider  albuterol  (VENTOLIN  HFA) 108 (90 Base) MCG/ACT inhaler Inhale 2 puffs into the lungs every 4 (four) hours as needed for wheezing or shortness of breath. 05/01/22  Kingsley, Victoria K, DO  atorvastatin  (LIPITOR) 10 MG tablet Take 0.5 tablets (5 mg total) by mouth daily. 07/01/23   Sherlynn Madden, MD  bictegravir-emtricitabine -tenofovir  AF (BIKTARVY ) 50-200-25 MG TABS tablet Take 1 tablet by mouth daily. 08/22/23   Dennise Kingsley, MD  finasteride  (PROSCAR ) 5 MG tablet Take 5 mg by mouth daily. 01/09/22   [provider]  INCRUSE ELLIPTA  62.5 MCG/ACT AEPB Inhale 1 puff into the lungs daily. Patient not taking: Reported on 09/09/2023 04/23/22   [provider]  omeprazole (PRILOSEC) 40 MG capsule Take 40 mg by mouth daily. 03/14/22   [provider]  SPIRIVA HANDIHALER  18 MCG inhalation capsule Place 18 mcg into inhaler and inhale daily. 03/03/23   [provider]  Turmeric (QC TUMERIC COMPLEX PO) Take by mouth as needed.    [provider]  zolpidem  (AMBIEN ) 5 MG tablet Take 1 tablet (5 mg total) by mouth at bedtime as needed. 07/01/23   Sherlynn Madden, MD    Scheduled Meds:  atorvastatin   5 mg Oral Daily   bictegravir-emtricitabine -tenofovir  AF  1 tablet Oral Daily   finasteride   5 mg Oral Daily   nicotine   21 mg Transdermal Daily   pantoprazole  (PROTONIX ) IV  40 mg Intravenous Q12H   umeclidinium bromide   1 puff Inhalation Daily   Continuous Infusions:  PRN Meds: acetaminophen , albuterol , hydrALAZINE , morphine  injection, nitroGLYCERIN , ondansetron  (ZOFRAN ) IV  Allergies:   No Known Allergies  Social History:   Social History   Socioeconomic History   Marital status: Single    Spouse name: Not on file   Number of children: Not on file   Years of education: Not on file   Highest education level: GED or equivalent  Occupational History   Not on file  Tobacco Use   Smoking status: Every Day    Current packs/day: 0.50    Average packs/day: 0.5 packs/day for 15.0 years (7.5 ttl pk-yrs)    Types: Cigarettes   Smokeless tobacco: Never   Tobacco comments:    On a break  Substance and Sexual Activity   Alcohol use: Yes   Drug use: Not Currently   Sexual activity: Not Currently    Birth control/protection: None  Other Topics Concern   Not on file  Social History Narrative   Not on file   Social Drivers of Health   Financial Resource Strain: Low Risk  (06/27/2023)   Overall Financial Resource Strain (CARDIA)    Difficulty of Paying Living Expenses: Not very hard  Food Insecurity: No Food Insecurity (09/09/2023)   Hunger Vital Sign    Worried About Running Out of Food in the Last Year: Never true    Ran Out of Food in the Last Year: Never true  Transportation Needs: No Transportation Needs (09/09/2023)   PRAPARE -  Administrator, Civil Service (Medical): No    Lack of Transportation (Non-Medical): No  Physical Activity: Sufficiently Active (06/27/2023)   Exercise Vital Sign    Days of Exercise per Week: 4 days    Minutes of Exercise per Session: 40 min  Stress: No Stress Concern Present (06/27/2023)   [employer] of Occupational Health - Occupational Stress Questionnaire    Feeling of Stress: Only a little  Social Connections: Moderately Isolated (06/27/2023)   Social Connection and Isolation Panel    Frequency of Communication with Friends and Family: More than three times a week    Frequency of Social Gatherings with Friends and Family: Three times  a week    Attends Religious Services: 1 to 4 times per year    Active Member of Clubs or Organizations: No    Attends Banker Meetings: Not on file    Marital Status: Divorced  Intimate Partner Violence: Not At Risk (09/09/2023)   Humiliation, Afraid, Rape, and Kick questionnaire    Fear of Current or Ex-Partner: No    Emotionally Abused: No    Physically Abused: No    Sexually Abused: No    Family History:   Family History  Problem Relation Age of Onset   Cancer Mother    Cancer Father      ROS:  Please see the history of present illness.  All other ROS reviewed and negative.     Physical Exam/Data: Vitals:   09/15/23 1345 09/15/23 1410 09/15/23 1515 09/15/23 1716  BP: (!) 175/90 (!) 142/81  (!) 140/96  Pulse: 70 97  84  Resp: (!) 21 13  15   Temp:   97.9 F (36.6 C) 97.9 F (36.6 C)  TempSrc:   Oral Oral  SpO2: 100% 98%  97%  Weight:      Height:       No intake or output data in the 24 hours ending 09/15/23 1749    09/15/2023    8:57 AM 09/09/2023   11:25 AM 07/01/2023   10:52 AM  Last 3 Weights  Weight (lbs) 244 lb 246 lb 6.4 oz 243 lb 9.6 oz  Weight (kg) 110.678 kg 111.766 kg 110.496 kg     Body mass index is 35.01 kg/m.  General:  Well nourished, well developed, in no acute distress HEENT:  normal Neck: no JVD seen, difficult to assess secondary to body habitus Vascular: No carotid bruits; Distal pulses 2+ bilaterally Cardiac:  normal S1, S2; RRR; no murmur  Lungs:  clear to auscultation bilaterally, no wheezing, rhonchi or rales, + upper airway wheeze Abd: soft, nontender, no hepatomegaly  Ext: no edema Musculoskeletal:  No deformities, BUE and BLE strength normal and equal Skin: warm and dry  Neuro:  CNs 2-12 intact, no focal abnormalities noted Psych:  Normal affect   EKG:  The EKG was personally reviewed and demonstrates:  SR, HR 86, no acute ischemic changes, no pathologic Q waves Telemetry:  Telemetry was personally reviewed and demonstrates: Sinus rhythm  Relevant CV Studies: Echo: Ordered  Laboratory Data: High Sensitivity Troponin:   Recent Labs  Lab 09/15/23 0905 09/15/23 1130  TROPONINIHS 6 5     Chemistry Recent Labs  Lab 09/15/23 0905  NA 138  K 4.2  CL 104  CO2 19*  GLUCOSE 144*  BUN 18  CREATININE 1.14  CALCIUM  8.8*  GFRNONAA >60  ANIONGAP 15    No results for input(s): PROT, ALBUMIN, AST, ALT, ALKPHOS, BILITOT in the last 168 hours. Lipids  Lab Results  Component Value Date   CHOL 173 04/22/2023   HDL 48 04/22/2023   LDLCALC 97 04/22/2023   TRIG 189 (H) 04/22/2023   CHOLHDL 3.6 04/22/2023     Hematology Recent Labs  Lab 09/15/23 0905  WBC 7.1  RBC 4.93  HGB 16.1  HCT 47.8  MCV 97.0  MCH 32.7  MCHC 33.7  RDW 14.1  PLT 250   Thyroid  No results for input(s): TSH, FREET4 in the last 168 hours.  BNPNo results for input(s): BNP, PROBNP in the last 168 hours.  DDimer No results for input(s): DDIMER in the last 168 hours. Lab Results  Component  Value Date   HGBA1C 6.2 (H) 05/08/2022     Radiology/Studies:  MR BRAIN WO CONTRAST Result Date: 09/15/2023 EXAM: MRI BRAIN WITHOUT CONTRAST 09/15/2023 04:28:33 PM TECHNIQUE: Multiplanar multisequence MRI of the head/brain was performed without the  administration of intravenous contrast. COMPARISON: CT head earlier same day. CLINICAL HISTORY: Mental status change, unknown cause. Pt came in for chest pain starting this AM, woke up with a headache but then got in the shower and started having chest pain, SOB, nausea, left arm numbness. Pt with labored breathing, and intense pains, headache. FINDINGS: BRAIN AND VENTRICLES: Significantly limited evaluation of the axial DWI images. There is no evidence of restricted diffusion on coronal DWI images. Within these limitations, there are no findings to suggest large acute infarct. There are few scattered areas of T2/FLAIR intensity in the supratentorial white matter suggestive of mild chronic microvascular ischemic changes. Similar appearance of arachnoid cyst within the posterior fossa. No intracranial hemorrhage. No mass. No midline shift. No hydrocephalus. The sella is unremarkable. Normal flow voids. ORBITS: No acute abnormality. SINUSES AND MASTOIDS: No acute abnormality. BONES AND SOFT TISSUES: Normal marrow signal. No acute soft tissue abnormality. IMPRESSION: 1. Axial DWI images are significantly limited. No evidence of acute infarct on coronal DWI images. 2. No acute intracranial abnormality. 3. Mild chronic microvascular ischemic changes. Electronically signed by: Donnice Mania MD 09/15/2023 05:05 PM EDT RP Workstation: [id number]   CT Angio Chest/Abd/Pel for Dissection W and/or Wo Contrast Result Date: 09/15/2023 CLINICAL DATA:  68 year old male with headache, left side chest pain, and shortness of breath. EXAM: CT ANGIOGRAPHY CHEST, ABDOMEN AND PELVIS TECHNIQUE: Non-contrast CT of the chest was initially obtained. Multidetector CT imaging through the chest, abdomen and pelvis was performed using the standard protocol during bolus administration of intravenous contrast. Multiplanar reconstructed images and MIPs were obtained and reviewed to evaluate the vascular anatomy. RADIATION DOSE REDUCTION: This exam  was performed according to the departmental dose-optimization program which includes automated exposure control, adjustment of the mA and/or kV according to patient size and/or use of iterative reconstruction technique. CONTRAST:  OMNIPAQUE  IOHEXOL  350 MG/ML SOLN COMPARISON:  Chest radiographs this morning. CT Chest, Abdomen, and Pelvis 12/14/2022. FINDINGS: CTA CHEST FINDINGS Cardiovascular: Normal heart size. No pericardial effusion. Calcified aortic atherosclerosis. Faint calcified coronary artery atherosclerosis suspected on series 14, image 26. Following contrast negative for thoracic aortic aneurysm or dissection. And the major bilateral pulmonary arteries are enhancing and appear to be patent. Mediastinum/Nodes: Negative. No mediastinal mass or lymphadenopathy. Lungs/Pleura: Saber sheath trachea configuration (series 9, image 33). Mildly lower lung volumes compared to last year. Mild right lateral tracheal wall nodularity versus retained secretions, new from last year. Major airways remain patent. But generalized bilateral bronchial wall thickening, chronic and stable. Multiple small bilateral lung nodules are stable from last year (annotated on series 9), most are subpleural, and these appear to be benign. No superimposed pleural effusion, consolidation, or active parenchymal lung infection identified. Mild costophrenic angle scarring and atelectasis. Musculoskeletal: Stable. No acute or suspicious osseous lesion in the chest. Review of the MIP images confirms the above findings. CTA ABDOMEN AND PELVIS FINDINGS VASCULAR Aortoiliac calcified atherosclerosis. Normal caliber abdominal aorta. Negative for dissection. Major arterial branches in the abdomen and pelvis remain patent. Proximal femoral arteries are patent. No portal venous system contrast. Review of the MIP images confirms the above findings. NON-VASCULAR Hepatobiliary: Stable and negative. No pericholecystic inflammation. No bile duct  enlargement. Pancreas: Fatty pancreatic atrophy has not significantly changed. Spleen: Stable  and negative.  Small splenule, normal variant. Adrenals/Urinary Tract: Normal adrenal glands. Nonobstructed kidneys with chronic simple density renal cysts (no follow-up imaging recommended). Symmetric renal enhancement. Diminutive ureters and bladder. Stomach/Bowel: Sigmoid colon redundancy and diverticulosis without active inflammation. Mild large bowel retained stool otherwise. Mild diverticula at the hepatic flexure. Evidence of prior appendectomy. Decompressed terminal ileum and nondilated small bowel. Stomach and duodenum appear negative. No pneumoperitoneum, free fluid, or mesenteric inflammation identified. Lymphatic: Stable subcentimeter abdominal and pelvic lymph nodes since last year, within normal limits. Reproductive: Small chronic fat containing right inguinal hernia is stable. Other: No pelvis free fluid. Musculoskeletal: Stable. Lower lumbar facet arthropathy. No acute or suspicious osseous lesion. Review of the MIP images confirms the above findings. IMPRESSION: 1. Negative for aortic aneurysm or dissection. Aortic Atherosclerosis (ICD10-I70.0). 2. Positive for saber-sheath-trachea appearance strongly suggesting COPD. And generalized chronic airway/bronchial thickening in the bilateral lungs appears stable from last year, suggesting Chronic Bronchitis. Mild distal trachea nodularity and/or retained secretions. No pneumonia or pleural effusion. Also small bilateral lung nodules stable since December 2024, most likely benign, but repeat noncontrast Chest CT in December 2026 recommended to document two years of stability. 3. No other acute or inflammatory process identified in the chest, abdomen, or pelvis. Occasional large bowel diverticula without active inflammation. Electronically Signed   By: VEAR Hurst M.D.   On: 09/15/2023 10:28   CT Head Wo Contrast Result Date: 09/15/2023 CLINICAL DATA:  Headache.  EXAM: CT HEAD WITHOUT CONTRAST TECHNIQUE: Contiguous axial images were obtained from the base of the skull through the vertex without intravenous contrast. RADIATION DOSE REDUCTION: This exam was performed according to the departmental dose-optimization program which includes automated exposure control, adjustment of the mA and/or kV according to patient size and/or use of iterative reconstruction technique. COMPARISON:  March 06, 2009 FINDINGS: Brain: No evidence of acute infarction, hemorrhage, hydrocephalus, extra-axial collection or mass lesion/mass effect. A stable 2.1 cm x 1.9 cm arachnoid cyst is seen within the posterior fossa, to the left of midline. Vascular: No hyperdense vessel or unexpected calcification. Skull: Normal. Negative for fracture or focal lesion. Sinuses/Orbits: No acute finding. Other: None. IMPRESSION: 1. No acute intracranial abnormality. 2. Stable posterior fossa arachnoid cyst. Electronically Signed   By: Suzen Dials M.D.   On: 09/15/2023 10:19   DG Chest 2 View Result Date: 09/15/2023 CLINICAL DATA:  68 year old male with left side chest pain and shortness of breath. Smoker. EXAM: CHEST - 2 VIEW COMPARISON:  CT Chest, Abdomen, and Pelvis today are reported separately. 12/14/2022 and earlier. FINDINGS: Portable AP semi upright view at 0913 hours. Lower lung volumes compared to last year. Mediastinal contours remain normal. Visualized tracheal air column is within normal limits. Allowing for portable technique the lungs are clear. No pneumothorax or pleural effusion. Paucity of bowel gas in the upper abdomen. No acute osseous abnormality identified. IMPRESSION: No acute cardiopulmonary abnormality. Electronically Signed   By: VEAR Hurst M.D.   On: 09/15/2023 09:27     Assessment and Plan: Chest pain -Cardiac enzymes are negative despite prolonged pain. -Only faint coronary calcium  was seen on the CT done for dissection - Prior to this acute event, patient had a good  baseline activity level - Cardiac risk factors include uncontrolled hypertension, hyperglycemia, obesity, and hyperlipidemia with LDL 97 earlier this year. -Echo is ordered, not performed yet - MD to review data and advise best test  2.  Hypertension - Per the patient, he did not have blood pressure issues until  he gained all the weight. - He has not been on blood pressure medicine in the past. - He has not yet been started on blood pressure med here. - Renal function is normal, consider amlodipine  5 or an ARB such as losartan  25  3. Hyperlipidemia - Prior to admission, he was on Lipitor 5 mg daily - If he is positive for CAD, will need to increase this, but we will have to be careful because of the Biktarvy   4.  History of HIV - He is very compliant with the Biktarvy , has not had any problems in a long time   Risk Assessment/Risk Scores:    TIMI Risk Score for Unstable Angina or Non-ST Elevation MI:   The patient's TIMI risk score is 3, which indicates a 13% risk of all cause mortality, new or recurrent myocardial infarction or need for urgent revascularization in the next 14 days.   For questions or updates, please contact [facility] Please consult [email or web address] for contact info under    Signed, Shona Shad, PA-C  09/15/2023 5:49 PM  Patient seen and examined   Pt is a 68 yo with hx of HIV, COPD, continued tobacco use, prediabetes, obesity, hep C.  Presentd to ER today with HA, CP, HTN.   Pt is a difficult historian    A few wks ago denied CP   His BP has been elevated  Over the past couple days his has felt fullness in chest and also fleeting L sided CP pains    Not associated with activity   On exam,  Pt is in NAD Neck is full  No bruits Lungs  Bilateral rhonchi (pt with coughing, reports brownish sputum chronically) Abd  Obese  No obvious massess or hepatomegaly   Mild LLQ pain Ext  2+ PT pulses   No LE edema    EKG shows SR without acute ST changes   Trop 5 and 6 CT without evid of dissection.   Faint coronary calcium    1  Chest pressure / chest pains   Pt with 2 distinct symptoms   I am not convinced represents coronary ischemia May reflect elevated blood pressure  May also reflect COPD  Pt is an active smoker   Agree with echo  Would start Rx for BP Follow symptoms as BP treated  2  HTN      Start with amlodipine  tonight  May need additional agents   Follow response   (has hydralazine  PRN) Echo as noted above  Hx of snoring   WOuld set up for outpt [place] sleep study   3  HL   Pt on lipitor  Check lipids in am  4  Metabolics  A1C pending     5  Tobacco  Counselled on cessatoin.  Vina Gull  MD

## 2023-09-15 NOTE — ED Triage Notes (Signed)
 Pt came in for chest pain starting this AM, woke up with a headache but then got in the shower and started having chest pain, SOB, nausea, left arm numbness. Pt with labored breathing, and intense pains, headache. Hx of HTN last 6 months, no cardiac hx. Aox4.

## 2023-09-15 NOTE — Telephone Encounter (Signed)
 Copied from CRM [id number]. Topic: Clinical - Red Word Triage >> Sep 15, 2023 11:57 AM Miquel SAILOR wrote: Red Word that prompted transfer to Nurse Triage: 172/98 is in ER now for 09/08. Called office instructed for NT Answer Assessment - Initial Assessment Questions Patient reports currently in Emergency Department at [facility]. Patient requesting appt with pcp for blood pressure medications. Advised patient to call back after evaluation with ED, to schedule f/u appt with PCP for medications.  Answer Assessment - Initial Assessment Questions Patient reports currently in Emergency Department at [facility]; being triaged. Patient requesting appt with pcp for blood pressure medications. Advised patient to call back after evaluation with ED, to schedule f/u appt with PCP for medications.  Protocols used: Blood Pressure - High-A-AH, Information Only Call - No Triage-A-AH

## 2023-09-15 NOTE — ED Provider Notes (Signed)
 Notifying CT team to waive labs given urgency and concern for dissection rule out   Robert Donnice PARAS, MD 09/15/23 1006

## 2023-09-15 NOTE — H&P (Signed)
 History and Physical    Patient: Robert Crosby FMW:[id number] DOB: 1955-07-24 DOA: 09/15/2023 DOS: the patient was seen and examined on 09/15/2023 . PCP: Sherlynn Madden, MD  Patient coming from: Home Chief complaint: Chief Complaint  Patient presents with   Chest Pain   HPI:  Robert Crosby is a 68 y.o. male with past medical history  of   well controlled HIV on Biktarvy , ongoing tobacco abuse and COPD, occasional alcohol, obesity with a BMI of 35.01 BPH, history of gastric ulcer, history of hepatitis C virus, history of syphilis, history of pulmonary nodules, history of prediabetes presenting with  Left sided chest pain dizzy and LA numbness.per report patient has been intermittently saying nonsensical things, pain is on the left side of his chest under his left pectoral area that is sharp and pressure-like.  On exam patient does not have any tenderness or any breast mass or lymphadenopathy noted.  ED Course:  Vital signs in the ED were notable for the following:  Vitals:   09/15/23 1330 09/15/23 1345 09/15/23 1410 09/15/23 1515  BP: (!) 168/82 (!) 175/90 (!) 142/81   Pulse: 68 70 97   Temp:    97.9 F (36.6 C)  Resp: 17 (!) 21 13   Height:      Weight:      SpO2: 100% 100% 98%   TempSrc:    Oral  BMI (Calculated):      >>ED evaluation thus far shows: Initial EKG shows sinus rhythm 86 with PR of 189, QTc of 419. Initial chest x-ray negative for any acute cardiopulmonary findings. Initial CTA chest abdomen and pelvis was negative for any aortic aneurysm or dissection did show pattern ColeDose saber-sheath trachea suggestive of COPD.  At bedside patient is somewhat dyspneic no he is eating but is not on any baseline oxygen placed patient on continuous pulse oximetry and monitor blood gas as needed. Initial BMP shows a bicarb of 19 AG of 15  and glucose of 144 normal kidney function LFTs added on and pending calcium  8.8. Troponin of 6 and repeat at 5 and flat. CBC within  normal limits.  >>While in the ED patient received the following: Medications  nitroGLYCERIN  (NITROSTAT ) SL tablet 0.4 mg (0.4 mg Sublingual Given 09/15/23 1411)  ketorolac  (TORADOL ) 30 MG/ML injection 30 mg (30 mg Intravenous Given 09/15/23 1134)  iohexol  (OMNIPAQUE ) 350 MG/ML injection 100 mL (100 mLs Intravenous Contrast Given 09/15/23 1014)   Review of Systems  Constitutional:  Positive for malaise/fatigue.  Cardiovascular:  Positive for chest pain.  Neurological:  Positive for dizziness.       Disorientation.     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 He has a 7.5 pack-year smoking history. He has never used smokeless tobacco. He reports current alcohol use. He reports that he does not currently use drugs. No Known Allergies Family History  Problem Relation Age of Onset   Cancer Mother    Cancer Father    Prior to Admission medications   Medication Sig Start Date End Date Taking? Authorizing Provider  albuterol  (VENTOLIN  HFA) 108 (90 Base) MCG/ACT inhaler Inhale 2 puffs into the lungs every 4 (four) hours as needed for wheezing or shortness of breath. 05/01/22  Kingsley, Victoria K, DO  atorvastatin  (LIPITOR) 10 MG tablet Take 0.5 tablets (5 mg total) by mouth daily. 07/01/23   Sherlynn Madden, MD  bictegravir-emtricitabine -tenofovir  AF (BIKTARVY ) 50-200-25 MG TABS tablet Take 1 tablet by mouth daily. 08/22/23   Dennise Kingsley, MD  finasteride  (PROSCAR ) 5 MG tablet Take 5 mg by mouth daily. 01/09/22   [provider]  INCRUSE ELLIPTA  62.5 MCG/ACT AEPB Inhale 1 puff into the lungs daily. Patient not taking: Reported on 09/09/2023 04/23/22   [provider]  omeprazole (PRILOSEC) 40 MG capsule Take 40 mg by mouth  daily. 03/14/22   [provider]  SPIRIVA HANDIHALER 18 MCG inhalation capsule Place 18 mcg into inhaler and inhale daily. 03/03/23   [provider]  Turmeric (QC TUMERIC COMPLEX PO) Take by mouth as needed.    [provider]  zolpidem  (AMBIEN ) 5 MG tablet Take 1 tablet (5 mg total) by mouth at bedtime as needed. 07/01/23   Sherlynn Madden, MD                                                                                 Vitals:   09/15/23 1330 09/15/23 1345 09/15/23 1410 09/15/23 1515  BP: (!) 168/82 (!) 175/90 (!) 142/81   Pulse: 68 70 97   Resp: 17 (!) 21 13   Temp:    97.9 F (36.6 C)  TempSrc:    Oral  SpO2: 100% 100% 98%   Weight:      Height:       Physical Exam Vitals reviewed.  Constitutional:      General: He is not in acute distress.    Appearance: He is obese. He is not ill-appearing.  HENT:     Head: Normocephalic and atraumatic.  Eyes:     Extraocular Movements: Extraocular movements intact.  Cardiovascular:     Rate and Rhythm: Normal rate and regular rhythm.     Pulses: Normal pulses.     Heart sounds: Normal heart sounds.  Pulmonary:     Effort: Pulmonary effort is normal.     Breath sounds: Normal breath sounds.  Abdominal:     General: There is no distension.     Palpations: Abdomen is soft.     Tenderness: There is no abdominal tenderness.  Neurological:     General: No focal deficit present.     Mental Status: He is alert and oriented to person, place, and time.     Labs on Admission: I have personally reviewed following labs and imaging studies CBC: Recent Labs  Lab 09/15/23 0905  WBC 7.1  HGB 16.1  HCT 47.8  MCV 97.0  PLT 250   Basic Metabolic Panel: Recent Labs  Lab 09/15/23 0905  NA 138  K 4.2  CL 104  CO2 19*  GLUCOSE 144*  BUN 18  CREATININE 1.14  CALCIUM  8.8*   GFR: Estimated Creatinine Clearance: 76.2 mL/min (by C-G formula based on SCr of 1.14 mg/dL). Liver Function Tests: No results  for input(s): AST, ALT, ALKPHOS, BILITOT, PROT, ALBUMIN in the last 168 hours. No results for input(s): LIPASE, AMYLASE in the last 168 hours. No results for input(s): AMMONIA in the  last 168 hours. Recent Labs    12/14/22 1018 01/06/23 1049 04/22/23 1356 06/24/23 1112 09/15/23 0905  BUN 21 26* 21 14 18   CREATININE 1.38* 1.17 1.14 1.07 1.14    Cardiac Enzymes: No results for input(s): CKTOTAL, CKMB, CKMBINDEX, TROPONINI in the last 168 hours. BNP (last 3 results) No results for input(s): PROBNP in the last 8760 hours. HbA1C: No results for input(s): HGBA1C in the last 72 hours. CBG: No results for input(s): GLUCAP in the last 168 hours. Lipid Profile: No results for input(s): CHOL, HDL, LDLCALC, TRIG, CHOLHDL, LDLDIRECT in the last 72 hours. Thyroid  Function Tests: No results for input(s): TSH, T4TOTAL, FREET4, T3FREE, THYROIDAB in the last 72 hours. Anemia Panel: No results for input(s): VITAMINB12, FOLATE, FERRITIN, TIBC, IRON, RETICCTPCT in the last 72 hours. Urine analysis:    Component Value Date/Time   COLORURINE AMBER (A) 12/14/2022 1018   APPEARANCEUR HAZY (A) 12/14/2022 1018   LABSPEC 1.030 12/14/2022 1018   PHURINE 5.0 12/14/2022 1018   GLUCOSEU NEGATIVE 12/14/2022 1018   HGBUR NEGATIVE 12/14/2022 1018   BILIRUBINUR NEGATIVE 12/14/2022 1018   KETONESUR 5 (A) 12/14/2022 1018   PROTEINUR 30 (A) 12/14/2022 1018   NITRITE NEGATIVE 12/14/2022 1018   LEUKOCYTESUR SMALL (A) 12/14/2022 1018   Radiological Exams on Admission: CT Angio Chest/Abd/Pel for Dissection W and/or Wo Contrast Result Date: 09/15/2023 CLINICAL DATA:  67 year old male with headache, left side chest pain, and shortness of breath. EXAM: CT ANGIOGRAPHY CHEST, ABDOMEN AND PELVIS TECHNIQUE: Non-contrast CT of the chest was initially obtained. Multidetector CT imaging through the chest, abdomen and pelvis was performed using the standard  protocol during bolus administration of intravenous contrast. Multiplanar reconstructed images and MIPs were obtained and reviewed to evaluate the vascular anatomy. RADIATION DOSE REDUCTION: This exam was performed according to the departmental dose-optimization program which includes automated exposure control, adjustment of the mA and/or kV according to patient size and/or use of iterative reconstruction technique. CONTRAST:  OMNIPAQUE  IOHEXOL  350 MG/ML SOLN COMPARISON:  Chest radiographs this morning. CT Chest, Abdomen, and Pelvis 12/14/2022. FINDINGS: CTA CHEST FINDINGS Cardiovascular: Normal heart size. No pericardial effusion. Calcified aortic atherosclerosis. Faint calcified coronary artery atherosclerosis suspected on series 14, image 26. Following contrast negative for thoracic aortic aneurysm or dissection. And the major bilateral pulmonary arteries are enhancing and appear to be patent. Mediastinum/Nodes: Negative. No mediastinal mass or lymphadenopathy. Lungs/Pleura: Saber sheath trachea configuration (series 9, image 33). Mildly lower lung volumes compared to last year. Mild right lateral tracheal wall nodularity versus retained secretions, new from last year. Major airways remain patent. But generalized bilateral bronchial wall thickening, chronic and stable. Multiple small bilateral lung nodules are stable from last year (annotated on series 9), most are subpleural, and these appear to be benign. No superimposed pleural effusion, consolidation, or active parenchymal lung infection identified. Mild costophrenic angle scarring and atelectasis. Musculoskeletal: Stable. No acute or suspicious osseous lesion in the chest. Review of the MIP images confirms the above findings. CTA ABDOMEN AND PELVIS FINDINGS VASCULAR Aortoiliac calcified atherosclerosis. Normal caliber abdominal aorta. Negative for dissection. Major arterial branches in the abdomen and pelvis remain patent. Proximal femoral arteries  are patent. No portal venous system contrast. Review of the MIP images confirms the above findings. NON-VASCULAR Hepatobiliary: Stable and negative. No pericholecystic inflammation. No bile duct enlargement. Pancreas: Fatty pancreatic atrophy has not significantly changed. Spleen: Stable and negative.  Small splenule, normal variant. Adrenals/Urinary Tract: Normal adrenal glands. Nonobstructed kidneys with chronic simple density renal cysts (  no follow-up imaging recommended). Symmetric renal enhancement. Diminutive ureters and bladder. Stomach/Bowel: Sigmoid colon redundancy and diverticulosis without active inflammation. Mild large bowel retained stool otherwise. Mild diverticula at the hepatic flexure. Evidence of prior appendectomy. Decompressed terminal ileum and nondilated small bowel. Stomach and duodenum appear negative. No pneumoperitoneum, free fluid, or mesenteric inflammation identified. Lymphatic: Stable subcentimeter abdominal and pelvic lymph nodes since last year, within normal limits. Reproductive: Small chronic fat containing right inguinal hernia is stable. Other: No pelvis free fluid. Musculoskeletal: Stable. Lower lumbar facet arthropathy. No acute or suspicious osseous lesion. Review of the MIP images confirms the above findings. IMPRESSION: 1. Negative for aortic aneurysm or dissection. Aortic Atherosclerosis (ICD10-I70.0). 2. Positive for saber-sheath-trachea appearance strongly suggesting COPD. And generalized chronic airway/bronchial thickening in the bilateral lungs appears stable from last year, suggesting Chronic Bronchitis. Mild distal trachea nodularity and/or retained secretions. No pneumonia or pleural effusion. Also small bilateral lung nodules stable since December 2024, most likely benign, but repeat noncontrast Chest CT in December 2026 recommended to document two years of stability. 3. No other acute or inflammatory process identified in the chest, abdomen, or pelvis. Occasional  large bowel diverticula without active inflammation. Electronically Signed   By: VEAR Hurst M.D.   On: 09/15/2023 10:28   CT Head Wo Contrast Result Date: 09/15/2023 CLINICAL DATA:  Headache. EXAM: CT HEAD WITHOUT CONTRAST TECHNIQUE: Contiguous axial images were obtained from the base of the skull through the vertex without intravenous contrast. RADIATION DOSE REDUCTION: This exam was performed according to the departmental dose-optimization program which includes automated exposure control, adjustment of the mA and/or kV according to patient size and/or use of iterative reconstruction technique. COMPARISON:  March 06, 2009 FINDINGS: Brain: No evidence of acute infarction, hemorrhage, hydrocephalus, extra-axial collection or mass lesion/mass effect. A stable 2.1 cm x 1.9 cm arachnoid cyst is seen within the posterior fossa, to the left of midline. Vascular: No hyperdense vessel or unexpected calcification. Skull: Normal. Negative for fracture or focal lesion. Sinuses/Orbits: No acute finding. Other: None. IMPRESSION: 1. No acute intracranial abnormality. 2. Stable posterior fossa arachnoid cyst. Electronically Signed   By: Suzen Dials M.D.   On: 09/15/2023 10:19   DG Chest 2 View Result Date: 09/15/2023 CLINICAL DATA:  68 year old male with left side chest pain and shortness of breath. Smoker. EXAM: CHEST - 2 VIEW COMPARISON:  CT Chest, Abdomen, and Pelvis today are reported separately. 12/14/2022 and earlier. FINDINGS: Portable AP semi upright view at 0913 hours. Lower lung volumes compared to last year. Mediastinal contours remain normal. Visualized tracheal air column is within normal limits. Allowing for portable technique the lungs are clear. No pneumothorax or pleural effusion. Paucity of bowel gas in the upper abdomen. No acute osseous abnormality identified. IMPRESSION: No acute cardiopulmonary abnormality. Electronically Signed   By: VEAR Hurst M.D.   On: 09/15/2023 09:27   Data Reviewed: Relevant  notes from primary care and specialist visits, past discharge summaries as available in EHR, including Care Everywhere . Prior diagnostic testing as pertinent to current admission diagnoses, Updated medications and problem lists for reconciliation .ED course, including vitals, labs, imaging, treatment and response to treatment,Triage notes, nursing and pharmacy notes and ED provider's notes.Notable results as noted in HPI.Discussed case with EDMD/ ED APP/ or Specialty MD on call and as needed.  Assessment & Plan  68 year old male with history of well-controlled HIV, hep C and history of syphilis, history of COPD and gastric ulcer presenting with atypical left-sided chest pain will admit  for rule out MI along with identifying underlying cause of his ongoing chest discomfort.  Low threshold for GI consult empiric IV PPIs until then.  >> Left-sided chest pain/atypical chest pain: Differentials on this case include cardiac and noncardiac specifically GI patient has a history of gastric ulcer, but he is also at risk for ischemic disease.  Have requested cardiology to consult on the patient and appreciate management and care, 2D echo ordered question if patient has any history of pericardial effusion or pericarditis no EKGs not showing any diffuse ST elevations.  Stress test per cardiology.  Nitroglycerin  sublingual, morphine  as needed.  Supplemental oxygen as deemed necessary based on continuous pulse oximetry.  IV PPI started and GI consult as deemed appropriate per a.m. team.  Currently patient is n.p.o. except for sips with meds.  >> Dizziness/disorientation: Less likely that this is a TIA presentation as patient has no focal deficits although his intermittent dizziness and disorientation is concerning with his intermittent history of elevated blood pressures we will obtain an MRI of the brain noncontrast and obtain a 2D echocardiogram.   >> HIV: Will continue patient on Biktarvy , but is low bicarb lactic  acid is pending.   >> Obesity BMI of 35.01/history of prediabetes: Will get A1c suspect patient has underlying diabetes.   >> Tobacco abuse/COPD: Nicotine  patch and albuterol  every 6 as needed Spiriva.   DVT prophylaxis:  Heparin Consults:  Cardiology  Advance Care Planning:    Code Status: Full Code   Family Communication:  Niece at bedside Disposition Plan:  Home Severity of Illness: The appropriate patient status for this patient is INPATIENT. Inpatient status is judged to be reasonable and necessary in order to provide the required intensity of service to ensure the patient's safety. The patient's presenting symptoms, physical exam findings, and initial radiographic and laboratory data in the context of their chronic comorbidities is felt to place them at high risk for further clinical deterioration. Furthermore, it is not anticipated that the patient will be medically stable for discharge from the hospital within 2 midnights of admission.   * I certify that at the point of admission it is my clinical judgment that the patient will require inpatient hospital care spanning beyond 2 midnights from the point of admission due to high intensity of service, high risk for further deterioration and high frequency of surveillance required.*  Unresulted Labs (From admission, onward)     Start     Ordered   09/16/23 0500  CBC with Differential/Platelet  Tomorrow morning,   R        09/15/23 1537   09/16/23 0500  Comprehensive metabolic panel with GFR  Tomorrow morning,   R        09/15/23 1537   09/16/23 0500  Magnesium  Tomorrow morning,   R        09/15/23 1537   09/16/23 0500  Phosphorus  Tomorrow morning,   R        09/15/23 1537   09/15/23 1538  TSH  Add-on,   AD        09/15/23 1537   09/15/23 1507  Hemoglobin A1c  Add-on,   AD        09/15/23 1506   09/15/23 1507  Lipid panel  Add-on,   AD        09/15/23 1506   09/15/23 1440  Ammonia  Once,   STAT        09/15/23 1443    09/15/23 1439  Hepatic function panel  Add-on,   AD        09/15/23 1443            Meds ordered this encounter  Medications   ketorolac  (TORADOL ) 30 MG/ML injection 30 mg   iohexol  (OMNIPAQUE ) 350 MG/ML injection 100 mL   nitroGLYCERIN  (NITROSTAT ) SL tablet 0.4 mg   acetaminophen  (TYLENOL ) tablet 650 mg   ondansetron  (ZOFRAN ) injection 4 mg   pantoprazole  (PROTONIX ) injection 40 mg   hydrALAZINE  (APRESOLINE ) injection 10 mg   morphine  (PF) 2 MG/ML injection 1 mg   albuterol  (PROVENTIL ) (2.5 MG/3ML) 0.083% nebulizer solution 2.5 mg   nicotine  (NICODERM CQ  - dosed in mg/24 hours) patch 21 mg   atorvastatin  (LIPITOR) tablet 5 mg   bictegravir-emtricitabine -tenofovir  AF (BIKTARVY ) 50-200-25 MG per tablet 1 tablet   finasteride  (PROSCAR ) tablet 5 mg   tiotropium (SPIRIVA) inhalation capsule ([facility] use ONLY) 18 mcg     Orders Placed This Encounter  Procedures   Resp panel by RT-PCR (RSV, Flu A&B, Covid) Anterior Nasal Swab   DG Chest 2 View   CT Angio Chest/Abd/Pel for Dissection W and/or Wo Contrast   CT Head Wo Contrast   MR BRAIN WO CONTRAST   Basic metabolic panel   CBC   Hepatic function panel   Ammonia   Hemoglobin A1c   Lipid panel   CBC with Differential/Platelet   Comprehensive metabolic panel with GFR   Magnesium   Phosphorus   TSH   Diet NPO time specified Except for: Sips with Meds, Ice Chips   Document Height and Actual Weight   ED Cardiac monitoring   Saline Lock IV, Maintain IV access (when placed in a treatment room)   Refer to Sidebar Report Mobility Protocol for Adult Inpatient   Apply Angina, Rule Out Myocardial Infarction Care Plan   Vital signs with O2 sat q4 hours x 24 hours, then q shift   Cardiac Monitoring Continuous x 24 hours Indications for use: Other; other indications for use: chest pain   RN may order Cardiology PRN Orders utilizing Cardiology PRN medications (through manage orders) for the following patient needs:   SCDs   Bed rest    Full code   Consult to hospitalist   Incentive spirometry RT   Pulse oximetry, continuous   I-Stat CG4 Lactic Acid   ED EKG   EKG 12-Lead   EKG 12-Lead (at 6am)   EKG 12-Lead (recurrent chest pain)   ECHOCARDIOGRAM COMPLETE   Admit to Inpatient (patient's expected length of stay will be greater than 2 midnights or inpatient only procedure)   Aspiration precautions   Fall precautions    Author: Mario LULLA Blanch, MD 12 pm- 8 pm. [facility]. 09/15/2023 3:38 PM Please note for any communication after hours contact [facility] Assigned provider on call on Amion.

## 2023-09-15 NOTE — Telephone Encounter (Signed)
 Windell Albino HERO, RN to [facility]  (Selected Message)     09/15/23 12:08 PM FYI, see notes   Patient to call back once released to schedule an appointment for follow up.

## 2023-09-15 NOTE — ED Notes (Signed)
 Pt transported to xray

## 2023-09-15 NOTE — ED Provider Notes (Signed)
 [place] [facility]MENT AT [facility] Provider Note   CSN: [id number] Arrival date & time: 09/15/23  9150     Patient presents with: Chest Pain   Robert Crosby is a 68 y.o. male with history of hepatitis C, reportedly cured, HIV undetectable on Biktarvy , high blood pressure, presenting to the ED with chest pain and dizziness.  Patient reports onset of symptoms this morning after he woke up.  He said he felt fine yesterday.  He says he began having pressure in the left side of his chest that feels a squeezing and sharp pain under his left breast.  He also has a sensation in his left shoulder and feels the left arm is numb.  He says he was feeling lightheaded.  He had a cough as well.  Reports muscle aches.  Denies any known coronary history.  He says he had a stress test about 5 years ago.  He smokes about 1 pack a day and has done so for many years.  He says he does not typically have anginal pain or chest pain with activity, does have a lot of physical work as he moves [employer].  He is here with his niece at the bedside.  He denies any known personal or family history of aneurysms   Patient reports compliance with his Biktarvy .  He reports his viral load was undetectable upon review most recent check for HIV.   HPI     Prior to Admission medications   Medication Sig Start Date End Date Taking? Authorizing Provider  albuterol  (VENTOLIN  HFA) 108 (90 Base) MCG/ACT inhaler Inhale 2 puffs into the lungs every 4 (four) hours as needed for wheezing or shortness of breath. 05/01/22   Kingsley, Victoria K, DO  atorvastatin  (LIPITOR) 10 MG tablet Take 0.5 tablets (5 mg total) by mouth daily. 07/01/23   Sherlynn Madden, MD  bictegravir-emtricitabine -tenofovir  AF (BIKTARVY ) 50-200-25 MG TABS tablet Take 1 tablet by mouth daily. 08/22/23   Dennise Kingsley, MD  finasteride  (PROSCAR ) 5 MG tablet Take 5 mg by mouth daily. 01/09/22   [provider]  INCRUSE ELLIPTA  62.5  MCG/ACT AEPB Inhale 1 puff into the lungs daily. Patient not taking: Reported on 09/09/2023 04/23/22   [provider]  omeprazole (PRILOSEC) 40 MG capsule Take 40 mg by mouth daily. 03/14/22   [provider]  SPIRIVA HANDIHALER 18 MCG inhalation capsule Place 18 mcg into inhaler and inhale daily. 03/03/23   [provider]  Turmeric (QC TUMERIC COMPLEX PO) Take by mouth as needed.    [provider]  zolpidem  (AMBIEN ) 5 MG tablet Take 1 tablet (5 mg total) by mouth at bedtime as needed. 07/01/23   Sherlynn Madden, MD    Allergies: Patient has no known allergies.    Review of Systems  Updated Vital Signs BP (!) 142/81   Pulse 97   Temp 97.9 F (36.6 C) (Oral)   Resp 13   Ht 5' 10 (1.778 m)   Wt 110.7 kg   SpO2 98%   BMI 35.01 kg/m   Physical Exam Constitutional:      General: He is in acute distress.     Comments: Occasionally spasming in the bed   HENT:     Head: Normocephalic and atraumatic.  Eyes:     Conjunctiva/sclera: Conjunctivae normal.     Pupils: Pupils are equal, round, and reactive to light.  Cardiovascular:     Rate and Rhythm: Normal rate and regular rhythm.  Pulmonary:  Effort: Pulmonary effort is normal. No respiratory distress.  Abdominal:     General: There is no distension.     Tenderness: There is no abdominal tenderness.  Musculoskeletal:     Comments: Tenderness along the paracervical muscles  Skin:    General: Skin is warm and dry.  Neurological:     General: No focal deficit present.     Mental Status: He is alert. Mental status is at baseline.  Psychiatric:        Mood and Affect: Mood normal.        Behavior: Behavior normal.     (all labs ordered are listed, but only abnormal results are displayed) Labs Reviewed  BASIC METABOLIC PANEL WITH GFR - Abnormal; Notable for the following components:      Result Value   CO2 19 (*)    Glucose, Bld 144 (*)    Calcium  8.8 (*)    All other components  within normal limits  RESP PANEL BY RT-PCR (RSV, FLU A&B, COVID)  RVPGX2  CBC  HEPATIC FUNCTION PANEL  AMMONIA  HEMOGLOBIN A1C  LIPID PANEL  TSH  I-STAT CG4 LACTIC ACID, ED  TROPONIN I (HIGH SENSITIVITY)  TROPONIN I (HIGH SENSITIVITY)    EKG: EKG Interpretation Date/Time:  Monday September 15 2023 08:57:38 EDT Ventricular Rate:  86 PR Interval:  189 QRS Duration:  87 QT Interval:  350 QTC Calculation: 419 R Axis:   44  Text Interpretation: Sinus rhythm Confirmed by Cottie Cough [phone]) on 09/15/2023 9:40:37 AM  Radiology: CT Angio Chest/Abd/Pel for Dissection W and/or Wo Contrast Result Date: 09/15/2023 CLINICAL DATA:  68 year old male with headache, left side chest pain, and shortness of breath. EXAM: CT ANGIOGRAPHY CHEST, ABDOMEN AND PELVIS TECHNIQUE: Non-contrast CT of the chest was initially obtained. Multidetector CT imaging through the chest, abdomen and pelvis was performed using the standard protocol during bolus administration of intravenous contrast. Multiplanar reconstructed images and MIPs were obtained and reviewed to evaluate the vascular anatomy. RADIATION DOSE REDUCTION: This exam was performed according to the departmental dose-optimization program which includes automated exposure control, adjustment of the mA and/or kV according to patient size and/or use of iterative reconstruction technique. CONTRAST:  OMNIPAQUE  IOHEXOL  350 MG/ML SOLN COMPARISON:  Chest radiographs this morning. CT Chest, Abdomen, and Pelvis 12/14/2022. FINDINGS: CTA CHEST FINDINGS Cardiovascular: Normal heart size. No pericardial effusion. Calcified aortic atherosclerosis. Faint calcified coronary artery atherosclerosis suspected on series 14, image 26. Following contrast negative for thoracic aortic aneurysm or dissection. And the major bilateral pulmonary arteries are enhancing and appear to be patent. Mediastinum/Nodes: Negative. No mediastinal mass or lymphadenopathy. Lungs/Pleura: Saber  sheath trachea configuration (series 9, image 33). Mildly lower lung volumes compared to last year. Mild right lateral tracheal wall nodularity versus retained secretions, new from last year. Major airways remain patent. But generalized bilateral bronchial wall thickening, chronic and stable. Multiple small bilateral lung nodules are stable from last year (annotated on series 9), most are subpleural, and these appear to be benign. No superimposed pleural effusion, consolidation, or active parenchymal lung infection identified. Mild costophrenic angle scarring and atelectasis. Musculoskeletal: Stable. No acute or suspicious osseous lesion in the chest. Review of the MIP images confirms the above findings. CTA ABDOMEN AND PELVIS FINDINGS VASCULAR Aortoiliac calcified atherosclerosis. Normal caliber abdominal aorta. Negative for dissection. Major arterial branches in the abdomen and pelvis remain patent. Proximal femoral arteries are patent. No portal venous system contrast. Review of the MIP images confirms the above findings. NON-VASCULAR Hepatobiliary:  Stable and negative. No pericholecystic inflammation. No bile duct enlargement. Pancreas: Fatty pancreatic atrophy has not significantly changed. Spleen: Stable and negative.  Small splenule, normal variant. Adrenals/Urinary Tract: Normal adrenal glands. Nonobstructed kidneys with chronic simple density renal cysts (no follow-up imaging recommended). Symmetric renal enhancement. Diminutive ureters and bladder. Stomach/Bowel: Sigmoid colon redundancy and diverticulosis without active inflammation. Mild large bowel retained stool otherwise. Mild diverticula at the hepatic flexure. Evidence of prior appendectomy. Decompressed terminal ileum and nondilated small bowel. Stomach and duodenum appear negative. No pneumoperitoneum, free fluid, or mesenteric inflammation identified. Lymphatic: Stable subcentimeter abdominal and pelvic lymph nodes since last year, within normal  limits. Reproductive: Small chronic fat containing right inguinal hernia is stable. Other: No pelvis free fluid. Musculoskeletal: Stable. Lower lumbar facet arthropathy. No acute or suspicious osseous lesion. Review of the MIP images confirms the above findings. IMPRESSION: 1. Negative for aortic aneurysm or dissection. Aortic Atherosclerosis (ICD10-I70.0). 2. Positive for saber-sheath-trachea appearance strongly suggesting COPD. And generalized chronic airway/bronchial thickening in the bilateral lungs appears stable from last year, suggesting Chronic Bronchitis. Mild distal trachea nodularity and/or retained secretions. No pneumonia or pleural effusion. Also small bilateral lung nodules stable since December 2024, most likely benign, but repeat noncontrast Chest CT in December 2026 recommended to document two years of stability. 3. No other acute or inflammatory process identified in the chest, abdomen, or pelvis. Occasional large bowel diverticula without active inflammation. Electronically Signed   By: VEAR Hurst M.D.   On: 09/15/2023 10:28   CT Head Wo Contrast Result Date: 09/15/2023 CLINICAL DATA:  Headache. EXAM: CT HEAD WITHOUT CONTRAST TECHNIQUE: Contiguous axial images were obtained from the base of the skull through the vertex without intravenous contrast. RADIATION DOSE REDUCTION: This exam was performed according to the departmental dose-optimization program which includes automated exposure control, adjustment of the mA and/or kV according to patient size and/or use of iterative reconstruction technique. COMPARISON:  March 06, 2009 FINDINGS: Brain: No evidence of acute infarction, hemorrhage, hydrocephalus, extra-axial collection or mass lesion/mass effect. A stable 2.1 cm x 1.9 cm arachnoid cyst is seen within the posterior fossa, to the left of midline. Vascular: No hyperdense vessel or unexpected calcification. Skull: Normal. Negative for fracture or focal lesion. Sinuses/Orbits: No acute  finding. Other: None. IMPRESSION: 1. No acute intracranial abnormality. 2. Stable posterior fossa arachnoid cyst. Electronically Signed   By: Suzen Dials M.D.   On: 09/15/2023 10:19   DG Chest 2 View Result Date: 09/15/2023 CLINICAL DATA:  68 year old male with left side chest pain and shortness of breath. Smoker. EXAM: CHEST - 2 VIEW COMPARISON:  CT Chest, Abdomen, and Pelvis today are reported separately. 12/14/2022 and earlier. FINDINGS: Portable AP semi upright view at 0913 hours. Lower lung volumes compared to last year. Mediastinal contours remain normal. Visualized tracheal air column is within normal limits. Allowing for portable technique the lungs are clear. No pneumothorax or pleural effusion. Paucity of bowel gas in the upper abdomen. No acute osseous abnormality identified. IMPRESSION: No acute cardiopulmonary abnormality. Electronically Signed   By: VEAR Hurst M.D.   On: 09/15/2023 09:27     Procedures   Medications Ordered in the ED  nitroGLYCERIN  (NITROSTAT ) SL tablet 0.4 mg (0.4 mg Sublingual Given 09/15/23 1411)  acetaminophen  (TYLENOL ) tablet 650 mg (has no administration in time range)  ondansetron  (ZOFRAN ) injection 4 mg (has no administration in time range)  pantoprazole  (PROTONIX ) injection 40 mg (has no administration in time range)  hydrALAZINE  (APRESOLINE ) injection 10 mg (has no  administration in time range)  morphine  (PF) 2 MG/ML injection 1 mg (has no administration in time range)  albuterol  (PROVENTIL ) (2.5 MG/3ML) 0.083% nebulizer solution 2.5 mg (has no administration in time range)  nicotine  (NICODERM CQ  - dosed in mg/24 hours) patch 21 mg (has no administration in time range)  atorvastatin  (LIPITOR) tablet 5 mg (has no administration in time range)  bictegravir-emtricitabine -tenofovir  AF (BIKTARVY ) 50-200-25 MG per tablet 1 tablet (has no administration in time range)  finasteride  (PROSCAR ) tablet 5 mg (has no administration in time range)  tiotropium (SPIRIVA)  inhalation capsule ([facility] use ONLY) 18 mcg (has no administration in time range)  LORazepam  (ATIVAN ) injection 1 mg (has no administration in time range)  ketorolac  (TORADOL ) 30 MG/ML injection 30 mg (30 mg Intravenous Given 09/15/23 1134)  iohexol  (OMNIPAQUE ) 350 MG/ML injection 100 mL (100 mLs Intravenous Contrast Given 09/15/23 1014)    Clinical Course as of 09/15/23 1621  Mon Sep 15, 2023  1019 Troponin I (High Sensitivity): 6 [MT]  1401 Patient continues complain of chest discomfort, left arm discomfort, and his family member at the bedside who is his niece reports that he seems discombobulated to her.  It is not clear what is correlating all the symptoms.  We will try 1 nitroglycerin  tablet.  He is agreeable to this.  No evidence otherwise localizing for stroke [MT]  1410 Code medical activated late at this time - no emergent imaging still needed. [MT]    Clinical Course User Index [MT] Alexei Doswell, Donnice PARAS, MD                                 Medical Decision Making Amount and/or Complexity of Data Reviewed Labs: ordered. Decision-making details documented in ED Course. Radiology: ordered.  Risk Prescription drug management. Decision regarding hospitalization.   This patient presents to the ED with concern for chest pain, arm numbness, cough, congestion. This involves an extensive number of treatment options, and is a complaint that carries with it a high risk of complications and morbidity.  The differential diagnosis includes viral illness versus other infection versus ACS versus aortic injury versus other  Co-morbidities that complicate the patient evaluation: Age and smoking history or risk factors for cardiovascular disease and aneurysm  Additional history obtained from family members at bedside  External records from outside source obtained and reviewed including CT scan of the chest abdomen pelvis December 2024 noting no evident aneurysm at the time.  Patient had small lung  nodules measuring up to 4 mm and aortic atherosclerosis noted.  He also had 2 right renal cyst.  I ordered and personally interpreted labs.  The pertinent results include: No emergent finding  I ordered imaging studies including CT head, CT dissection study I independently visualized and interpreted imaging which showed incidental pulmonary nodules emphysematous changes, no emergent findings I agree with the radiologist interpretation  The patient was maintained on a cardiac monitor.  I personally viewed and interpreted the cardiac monitored which showed an underlying rhythm of: Sinus rhythm  Per my interpretation the patient's ECG shows sinus rhythm with no acute ischemic findings  I ordered medication including IV Toradol  for headache and chest pain; nitroglycerin  for chest pain  I have reviewed the patients home medicines and have made adjustments as needed  Test Considered: Low suspicion for acute PE  After the interventions noted above, I reevaluated the patient and found that they have: stayed the same  Social Determinants of Health: smoking cessation discussed with patient and family  Dispostion:  After consideration of the diagnostic results and the patients response to treatment, I feel that the patent would benefit from medical admission.      Final diagnoses:  Chest pain, unspecified type  Dizziness    ED Discharge Orders     None          Cottie Donnice PARAS, MD 09/15/23 [phone]

## 2023-09-15 NOTE — ED Triage Notes (Signed)
 C/o sharp chest pain onset this am , patient is having jerking type movement

## 2023-09-15 NOTE — Hospital Course (Signed)
 [name]

## 2023-09-15 NOTE — Progress Notes (Incomplete)
 69 y/o male w/prediabetes, COPD, nicotine  dependence, HIV being treated w/Biktarvy , syphilis, hep C, h/o gastric ulcer, obesity, admitted with chest pain.   EKG 09/15/2023: Sinus rhythm 86 bpm Normal EKG  1. Negative for aortic aneurysm or dissection. Aortic Atherosclerosis (ICD10-I70.0).   Cardiovascular: Normal heart size. No pericardial effusion. Calcified aortic atherosclerosis. Faint calcified coronary artery atherosclerosis suspected on series 14, image 26. Following contrast negative for thoracic aortic aneurysm or dissection. And the major bilateral pulmonary arteries are enhancing and appear to be patent.    2. Positive for saber-sheath-trachea appearance strongly suggesting COPD. And generalized chronic airway/bronchial thickening in the bilateral lungs appears stable from last year, suggesting Chronic Bronchitis. Mild distal trachea nodularity and/or retained secretions. No pneumonia or pleural effusion. Also small bilateral lung nodules stable since December 2024, most likely benign, but repeat noncontrast Chest CT in December 2026 recommended to document two years of stability.   3. No other acute or inflammatory process identified in the chest, abdomen, or pelvis. Occasional large bowel diverticula without active inflammation.

## 2023-09-16 ENCOUNTER — Inpatient Hospital Stay (HOSPITAL_BASED_OUTPATIENT_CLINIC_OR_DEPARTMENT_OTHER)

## 2023-09-16 ENCOUNTER — Other Ambulatory Visit: Payer: Self-pay

## 2023-09-16 ENCOUNTER — Encounter (HOSPITAL_COMMUNITY): Payer: Self-pay | Admitting: Internal Medicine

## 2023-09-16 DIAGNOSIS — R0602 Shortness of breath: Secondary | ICD-10-CM | POA: Diagnosis not present

## 2023-09-16 DIAGNOSIS — J449 Chronic obstructive pulmonary disease, unspecified: Secondary | ICD-10-CM | POA: Diagnosis not present

## 2023-09-16 DIAGNOSIS — Z21 Asymptomatic human immunodeficiency virus [HIV] infection status: Secondary | ICD-10-CM

## 2023-09-16 DIAGNOSIS — F17210 Nicotine dependence, cigarettes, uncomplicated: Secondary | ICD-10-CM | POA: Diagnosis not present

## 2023-09-16 DIAGNOSIS — F1090 Alcohol use, unspecified, uncomplicated: Secondary | ICD-10-CM | POA: Diagnosis not present

## 2023-09-16 DIAGNOSIS — R079 Chest pain, unspecified: Secondary | ICD-10-CM | POA: Diagnosis not present

## 2023-09-16 DIAGNOSIS — R0789 Other chest pain: Secondary | ICD-10-CM | POA: Diagnosis not present

## 2023-09-16 DIAGNOSIS — Z1152 Encounter for screening for COVID-19: Secondary | ICD-10-CM | POA: Diagnosis not present

## 2023-09-16 DIAGNOSIS — I10 Essential (primary) hypertension: Secondary | ICD-10-CM | POA: Diagnosis not present

## 2023-09-16 LAB — COMPREHENSIVE METABOLIC PANEL WITH GFR
ALT: 21 U/L (ref 0–44)
AST: 19 U/L (ref 15–41)
Albumin: 3.3 g/dL — ABNORMAL LOW (ref 3.5–5.0)
Alkaline Phosphatase: 49 U/L (ref 38–126)
Anion gap: 10 (ref 5–15)
BUN: 15 mg/dL (ref 8–23)
CO2: 25 mmol/L (ref 22–32)
Calcium: 8.8 mg/dL — ABNORMAL LOW (ref 8.9–10.3)
Chloride: 101 mmol/L (ref 98–111)
Creatinine, Ser: 1.16 mg/dL (ref 0.61–1.24)
GFR, Estimated: >60 mL/min (ref >60)
Glucose, Bld: 133 mg/dL — ABNORMAL HIGH (ref 70–99)
Potassium: 4.5 mmol/L (ref 3.5–5.1)
Sodium: 136 mmol/L (ref 135–145)
Total Bilirubin: 0.7 mg/dL (ref 0.0–1.2)
Total Protein: 6.6 g/dL (ref 6.5–8.1)

## 2023-09-16 LAB — MAGNESIUM: Magnesium: 2 mg/dL (ref 1.7–2.4)

## 2023-09-16 LAB — CBC WITH DIFFERENTIAL/PLATELET
Abs Immature Granulocytes: 0.05 K/uL (ref 0.00–0.07)
Basophils Absolute: 0 K/uL (ref 0.0–0.1)
Basophils Relative: 1 %
Eosinophils Absolute: 0.2 K/uL (ref 0.0–0.5)
Eosinophils Relative: 3 %
HCT: 45.5 % (ref 39.0–52.0)
Hemoglobin: 15.4 g/dL (ref 13.0–17.0)
Immature Granulocytes: 1 %
Lymphocytes Relative: 24 %
Lymphs Abs: 2 K/uL (ref 0.7–4.0)
MCH: 32.3 pg (ref 26.0–34.0)
MCHC: 33.8 g/dL (ref 30.0–36.0)
MCV: 95.4 fL (ref 80.0–100.0)
Monocytes Absolute: 0.6 K/uL (ref 0.1–1.0)
Monocytes Relative: 7 %
Neutro Abs: 5.3 K/uL (ref 1.7–7.7)
Neutrophils Relative %: 64 %
Platelets: 236 K/uL (ref 150–400)
RBC: 4.77 MIL/uL (ref 4.22–5.81)
RDW: 13.9 % (ref 11.5–15.5)
WBC: 8.3 K/uL (ref 4.0–10.5)
nRBC: 0 % (ref 0.0–0.2)

## 2023-09-16 LAB — ECHOCARDIOGRAM COMPLETE
Area-P 1/2: 3.91 cm2
Calc EF: 51.4 %
Height: 70 in
S' Lateral: 3.2 cm
Single Plane A2C EF: 46.6 %
Single Plane A4C EF: 57.2 %
Weight: 3904 [oz_av]

## 2023-09-16 LAB — PHOSPHORUS: Phosphorus: 3.6 mg/dL (ref 2.5–4.6)

## 2023-09-16 MED ORDER — ATORVASTATIN CALCIUM 10 MG PO TABS
10.0000 mg | ORAL_TABLET | Freq: Every day | ORAL | Status: DC
Start: 2023-09-16 — End: 2023-09-16

## 2023-09-16 MED ORDER — LOSARTAN POTASSIUM 25 MG PO TABS
25.0000 mg | ORAL_TABLET | Freq: Every day | ORAL | Status: DC
  Administered 2023-09-16 – 2023-09-17 (×2): 25 mg via ORAL
  Filled 2023-09-16 (×2): qty 1

## 2023-09-16 MED ORDER — UMECLIDINIUM BROMIDE 62.5 MCG/ACT IN AEPB
1.0000 | INHALATION_SPRAY | Freq: Every day | RESPIRATORY_TRACT | Status: DC
  Filled 2023-09-16: qty 7

## 2023-09-16 NOTE — Care Management CC44 (Signed)
 Condition Code 44 Documentation Completed  Patient Details  Name: Robert Crosby MRN: [id number] Date of Birth: 01/10/1955   Condition Code 44 given:  Yes Patient signature on Condition Code 44 notice:  Yes Documentation of 2 MD's agreement:  Yes Code 44 added to claim:  Yes    Sudie Erminio Deems, RN 09/16/2023, 2:56 PM

## 2023-09-16 NOTE — Progress Notes (Signed)
  Echocardiogram 2D Echocardiogram has been performed.  Robert Crosby 09/16/2023, 11:07 AM

## 2023-09-16 NOTE — Plan of Care (Signed)
  Problem: Activity: Goal: Ability to tolerate increased activity will improve Outcome: Progressing   Problem: Cardiac: Goal: Ability to achieve and maintain adequate cardiovascular perfusion will improve Outcome: Progressing   Problem: Clinical Measurements: Goal: Respiratory complications will improve Outcome: Progressing Goal: Cardiovascular complication will be avoided Outcome: Progressing   Problem: Activity: Goal: Risk for activity intolerance will decrease Outcome: Progressing   Problem: Pain Managment: Goal: General experience of comfort will improve and/or be controlled Outcome: Progressing   Problem: Safety: Goal: Ability to remain free from injury will improve Outcome: Progressing

## 2023-09-16 NOTE — Progress Notes (Signed)
 [facility]                                                                               Robert Crosby, is a 68 y.o. male, DOB - 1955/08/04, FMW:[id number] Admit date - 09/15/2023    Outpatient Primary MD for the patient is Robert Madden, MD  LOS - 1  days    Brief summary   Robert Crosby is a 68 y.o. male with past medical history  of   well controlled HIV on Biktarvy , ongoing tobacco abuse and COPD, occasional alcohol, obesity with a BMI of 35.01 BPH, history of gastric ulcer, history of hepatitis C virus, history of syphilis, history of pulmonary nodules, history of prediabetes presenting with  Left sided chest pain.  ACS is ruled out.   CTA chest abdomen and pelvis was negative for any aortic aneurysm or dissection did show pattern ColeDose saber-sheath trachea suggestive of COPD.  Assessment & Plan    Assessment and Plan:  Left sided chest pain, atypical Differential include costochondritis vs from lipomas.  Intermittent and sharp.  ACS is ruled out.  Echo pending.  Cardiology on board.  LIPID panel abnormal, was on lipitor, but patient requesting to stop it due to generalized muscle aches.  Will get CK levels.  Liver panel wnl.    HIV Resume home meds.    COPD Stable, no wheezing heard.     Estimated body mass index is 35.01 kg/m as calculated from the following:   Height as of this encounter: 5' 10 (1.778 m).   Weight as of this encounter: 110.7 kg.  Code Status: full code.  DVT Prophylaxis:  SCDs Start: 09/15/23 1504   Level of Care: Level of care: Telemetry Cardiac Family Communication: None at bedside.   Disposition Plan:     Remains inpatient appropriate:  pending.   Procedures:  Echocardiogram.   Consultants:   Cardiology.   Antimicrobials:   Anti-infectives (From admission, onward)    Start     Dose/Rate Route Frequency Ordered Stop   09/15/23 1545  bictegravir-emtricitabine -tenofovir  AF (BIKTARVY ) 50-200-25  MG per tablet 1 tablet        1 tablet Oral Daily 09/15/23 1536          Medications  Scheduled Meds:  amLODipine   5 mg Oral Daily   bictegravir-emtricitabine -tenofovir  AF  1 tablet Oral Daily   finasteride   5 mg Oral Daily   losartan   25 mg Oral Daily   nicotine   21 mg Transdermal Daily   pantoprazole  (PROTONIX ) IV  40 mg Intravenous Q12H   umeclidinium bromide   1 puff Inhalation Daily   Continuous Infusions: PRN Meds:.acetaminophen , albuterol , hydrALAZINE , morphine  injection, nitroGLYCERIN , ondansetron  (ZOFRAN ) IV    Subjective:   [name] was seen and examined today.  Patient reports generalized muscle aches after taking lipitor, and requesting to stop it.  No nausea or vomiting. No sob, some intermittent sharp chest pain.  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   Objective:   Vitals:   09/15/23 2042 09/16/23 0000 09/16/23 0655 09/16/23 0727  BP: 137/82 133/77 (!) 153/86 (!) 165/95  Pulse: 82 93 85 82  Resp: 16 17 19  19  Temp: 97.9 F (36.6 C) 97.9 F (36.6 C) 98.2 F (36.8 C) 97.7 F (36.5 C)  TempSrc: Oral Oral Oral Oral  SpO2: 98% 97% 98% 97%  Weight:      Height:        Intake/Output Summary (Last 24 hours) at 09/16/2023 1142 Last data filed at 09/16/2023 1000 Gross per 24 hour  Intake 720 ml  Output 100 ml  Net 620 ml   Filed Weights   09/15/23 0857  Weight: 110.7 kg     Exam General exam: Appears calm and comfortable  Respiratory system: Clear to auscultation. Respiratory effort normal. Cardiovascular system: S1 & S2 heard, RRR. No JVD, Gastrointestinal system: Abdomen is nondistended, soft and nontender.  Central nervous system: Alert and oriented. No focal neurological deficits. Extremities: Symmetric 5 x 5 power. Skin: No rashes,  Psychiatry: Mood & affect appropriate.    Data Reviewed:  I have personally reviewed following labs and imaging studies   CBC Lab Results  Component Value Date   WBC 8.3 09/16/2023   RBC 4.77  09/16/2023   HGB 15.4 09/16/2023   HCT 45.5 09/16/2023   MCV 95.4 09/16/2023   MCH 32.3 09/16/2023   PLT 236 09/16/2023   MCHC 33.8 09/16/2023   RDW 13.9 09/16/2023   LYMPHSABS 2.0 09/16/2023   MONOABS 0.6 09/16/2023   EOSABS 0.2 09/16/2023   BASOSABS 0.0 09/16/2023     Last metabolic panel Lab Results  Component Value Date   NA 136 09/16/2023   K 4.5 09/16/2023   CL 101 09/16/2023   CO2 25 09/16/2023   BUN 15 09/16/2023   CREATININE 1.16 09/16/2023   GLUCOSE 133 (H) 09/16/2023   GFRNONAA >60 09/16/2023   CALCIUM  8.8 (L) 09/16/2023   PHOS 3.6 09/16/2023   PROT 6.6 09/16/2023   ALBUMIN 3.3 (L) 09/16/2023   BILITOT 0.7 09/16/2023   ALKPHOS 49 09/16/2023   AST 19 09/16/2023   ALT 21 09/16/2023   ANIONGAP 10 09/16/2023    CBG (last 3)  No results for input(s): GLUCAP in the last 72 hours.    Coagulation Profile: No results for input(s): INR, PROTIME in the last 168 hours.   Radiology Studies: MR BRAIN WO CONTRAST Result Date: 09/15/2023 EXAM: MRI BRAIN WITHOUT CONTRAST 09/15/2023 04:28:33 PM TECHNIQUE: Multiplanar multisequence MRI of the head/brain was performed without the administration of intravenous contrast. COMPARISON: CT head earlier same day. CLINICAL HISTORY: Mental status change, unknown cause. Pt came in for chest pain starting this AM, woke up with a headache but then got in the shower and started having chest pain, SOB, nausea, left arm numbness. Pt with labored breathing, and intense pains, headache. FINDINGS: BRAIN AND VENTRICLES: Significantly limited evaluation of the axial DWI images. There is no evidence of restricted diffusion on coronal DWI images. Within these limitations, there are no findings to suggest large acute infarct. There are few scattered areas of T2/FLAIR intensity in the supratentorial white matter suggestive of mild chronic microvascular ischemic changes. Similar appearance of arachnoid cyst within the posterior fossa. No  intracranial hemorrhage. No mass. No midline shift. No hydrocephalus. The sella is unremarkable. Normal flow voids. ORBITS: No acute abnormality. SINUSES AND MASTOIDS: No acute abnormality. BONES AND SOFT TISSUES: Normal marrow signal. No acute soft tissue abnormality. IMPRESSION: 1. Axial DWI images are significantly limited. No evidence of acute infarct on coronal DWI images. 2. No acute intracranial abnormality. 3. Mild chronic microvascular ischemic changes. Electronically signed by: Donnice Mania MD 09/15/2023 05:05 PM EDT  RP Workstation: [id number]   CT Angio Chest/Abd/Pel for Dissection W and/or Wo Contrast Result Date: 09/15/2023 CLINICAL DATA:  68 year old male with headache, left side chest pain, and shortness of breath. EXAM: CT ANGIOGRAPHY CHEST, ABDOMEN AND PELVIS TECHNIQUE: Non-contrast CT of the chest was initially obtained. Multidetector CT imaging through the chest, abdomen and pelvis was performed using the standard protocol during bolus administration of intravenous contrast. Multiplanar reconstructed images and MIPs were obtained and reviewed to evaluate the vascular anatomy. RADIATION DOSE REDUCTION: This exam was performed according to the departmental dose-optimization program which includes automated exposure control, adjustment of the mA and/or kV according to patient size and/or use of iterative reconstruction technique. CONTRAST:  OMNIPAQUE  IOHEXOL  350 MG/ML SOLN COMPARISON:  Chest radiographs this morning. CT Chest, Abdomen, and Pelvis 12/14/2022. FINDINGS: CTA CHEST FINDINGS Cardiovascular: Normal heart size. No pericardial effusion. Calcified aortic atherosclerosis. Faint calcified coronary artery atherosclerosis suspected on series 14, image 26. Following contrast negative for thoracic aortic aneurysm or dissection. And the major bilateral pulmonary arteries are enhancing and appear to be patent. Mediastinum/Nodes: Negative. No mediastinal mass or lymphadenopathy. Lungs/Pleura:  Saber sheath trachea configuration (series 9, image 33). Mildly lower lung volumes compared to last year. Mild right lateral tracheal wall nodularity versus retained secretions, new from last year. Major airways remain patent. But generalized bilateral bronchial wall thickening, chronic and stable. Multiple small bilateral lung nodules are stable from last year (annotated on series 9), most are subpleural, and these appear to be benign. No superimposed pleural effusion, consolidation, or active parenchymal lung infection identified. Mild costophrenic angle scarring and atelectasis. Musculoskeletal: Stable. No acute or suspicious osseous lesion in the chest. Review of the MIP images confirms the above findings. CTA ABDOMEN AND PELVIS FINDINGS VASCULAR Aortoiliac calcified atherosclerosis. Normal caliber abdominal aorta. Negative for dissection. Major arterial branches in the abdomen and pelvis remain patent. Proximal femoral arteries are patent. No portal venous system contrast. Review of the MIP images confirms the above findings. NON-VASCULAR Hepatobiliary: Stable and negative. No pericholecystic inflammation. No bile duct enlargement. Pancreas: Fatty pancreatic atrophy has not significantly changed. Spleen: Stable and negative.  Small splenule, normal variant. Adrenals/Urinary Tract: Normal adrenal glands. Nonobstructed kidneys with chronic simple density renal cysts (no follow-up imaging recommended). Symmetric renal enhancement. Diminutive ureters and bladder. Stomach/Bowel: Sigmoid colon redundancy and diverticulosis without active inflammation. Mild large bowel retained stool otherwise. Mild diverticula at the hepatic flexure. Evidence of prior appendectomy. Decompressed terminal ileum and nondilated small bowel. Stomach and duodenum appear negative. No pneumoperitoneum, free fluid, or mesenteric inflammation identified. Lymphatic: Stable subcentimeter abdominal and pelvic lymph nodes since last year, within  normal limits. Reproductive: Small chronic fat containing right inguinal hernia is stable. Other: No pelvis free fluid. Musculoskeletal: Stable. Lower lumbar facet arthropathy. No acute or suspicious osseous lesion. Review of the MIP images confirms the above findings. IMPRESSION: 1. Negative for aortic aneurysm or dissection. Aortic Atherosclerosis (ICD10-I70.0). 2. Positive for saber-sheath-trachea appearance strongly suggesting COPD. And generalized chronic airway/bronchial thickening in the bilateral lungs appears stable from last year, suggesting Chronic Bronchitis. Mild distal trachea nodularity and/or retained secretions. No pneumonia or pleural effusion. Also small bilateral lung nodules stable since December 2024, most likely benign, but repeat noncontrast Chest CT in December 2026 recommended to document two years of stability. 3. No other acute or inflammatory process identified in the chest, abdomen, or pelvis. Occasional large bowel diverticula without active inflammation. Electronically Signed   By: VEAR Hurst M.D.   On: 09/15/2023 10:28  CT Head Wo Contrast Result Date: 09/15/2023 CLINICAL DATA:  Headache. EXAM: CT HEAD WITHOUT CONTRAST TECHNIQUE: Contiguous axial images were obtained from the base of the skull through the vertex without intravenous contrast. RADIATION DOSE REDUCTION: This exam was performed according to the departmental dose-optimization program which includes automated exposure control, adjustment of the mA and/or kV according to patient size and/or use of iterative reconstruction technique. COMPARISON:  March 06, 2009 FINDINGS: Brain: No evidence of acute infarction, hemorrhage, hydrocephalus, extra-axial collection or mass lesion/mass effect. A stable 2.1 cm x 1.9 cm arachnoid cyst is seen within the posterior fossa, to the left of midline. Vascular: No hyperdense vessel or unexpected calcification. Skull: Normal. Negative for fracture or focal lesion. Sinuses/Orbits: No acute  finding. Other: None. IMPRESSION: 1. No acute intracranial abnormality. 2. Stable posterior fossa arachnoid cyst. Electronically Signed   By: Suzen Dials M.D.   On: 09/15/2023 10:19   DG Chest 2 View Result Date: 09/15/2023 CLINICAL DATA:  68 year old male with left side chest pain and shortness of breath. Smoker. EXAM: CHEST - 2 VIEW COMPARISON:  CT Chest, Abdomen, and Pelvis today are reported separately. 12/14/2022 and earlier. FINDINGS: Portable AP semi upright view at 0913 hours. Lower lung volumes compared to last year. Mediastinal contours remain normal. Visualized tracheal air column is within normal limits. Allowing for portable technique the lungs are clear. No pneumothorax or pleural effusion. Paucity of bowel gas in the upper abdomen. No acute osseous abnormality identified. IMPRESSION: No acute cardiopulmonary abnormality. Electronically Signed   By: VEAR Hurst M.D.   On: 09/15/2023 09:27       Elgie Butter M.D. [facility] 09/16/2023, 11:42 AM  Available via Epic secure chat 7am-7pm After 7 pm, please refer to night coverage provider listed on amion.

## 2023-09-16 NOTE — Progress Notes (Addendum)
  Progress Note  Patient Name: Robert Crosby Date of Encounter: 09/16/2023 [facility] Cardiologist: Vina Gull, MD    Interval Summary    Patient reports feeling well this AM. No chest pain or SOB. Pending echocardiogram    Vital Signs Vitals:   09/15/23 2042 09/16/23 0000 09/16/23 0655 09/16/23 0727  BP: 137/82 133/77 (!) 153/86 (!) 165/95  Pulse: 82 93 85 82  Resp: 16 17 19 19   Temp: 97.9 F (36.6 C) 97.9 F (36.6 C) 98.2 F (36.8 C) 97.7 F (36.5 C)  TempSrc: Oral Oral Oral Oral  SpO2: 98% 97% 98% 97%  Weight:      Height:        Intake/Output Summary (Last 24 hours) at 09/16/2023 0813 Last data filed at 09/16/2023 0700 Gross per 24 hour  Intake 480 ml  Output 100 ml  Net 380 ml      09/15/2023    8:57 AM 09/09/2023   11:25 AM 07/01/2023   10:52 AM  Last 3 Weights  Weight (lbs) 244 lb 246 lb 6.4 oz 243 lb 9.6 oz  Weight (kg) 110.678 kg 111.766 kg 110.496 kg      Telemetry/ECG  NSR  - Personally Reviewed  Physical Exam  GEN: No acute distress.  Laying flat in bed  Neck: No JVD Cardiac:  RRR, no murmurs, rubs, or gallops.  Respiratory: Clear to auscultation bilaterally. GI: Soft, nontender, non-distended  MS: No edema in BLE   Assessment & Plan   Chest pain  - Presented with an episode of chest pain that occurred in the setting of elevated BP. Pain not pleuritic. Denies exertional chest pain  - hsTn negative x2. EKG without ischemic changes  - Pain reproducible on palpation  - Echocardiogram pending today  - Further recs pending echocardiogram   HTN  - BP has been as high as 175/101 this admission. He was not on antihypertensives PTA  - Continue amlodipine  5 mg daily  - Start losartan  25 mg daily   HLD  - Lipid panel this admission showed LDL 112  -Increase lipitor to 10 mg daily   Otherwise per primary  - HIV  - Dizziness/disorientation  - Obesity  - COPD   For questions or updates, please contact  [facility] Please  consult [email or web address] for contact info under       Signed, Rollo FABIENE Louder, PA-C   Pt seen and examined   I agree with findings as noted above by K Johnson Pt denies CP  Echo just about to begin  Lungs are CTA Chest   Tender to palpation L parasternal area Abd is benign Ext are without edema   1  CP  Atypical   Echo ordered     Pt with mild coronary calcifications on CT   Would rx risk factors  2  HTN  BP is still high  Agree with addition of losartan  to amlodipine    3  HL  LIpitor increased  Vina Gull MD

## 2023-09-16 NOTE — TOC CM/SW Note (Signed)
 Transition of Care [facility]) - Inpatient Brief Assessment   Patient Details  Name: Robert Crosby MRN: [id number] Date of Birth: 1955/07/03  Transition of Care [facility]) CM/SW Contact:    Sudie Erminio Deems, RN Phone Number: 09/16/2023, 2:58 PM   Clinical Narrative: Patient presented for chest pain. PTA patient was from home. Patient has PCP and gets to appointments without any issues. Patient has insurance and affords medications. No home needs identified at this time.    Transition of Care Asessment: Insurance and Status: Insurance coverage has been reviewed Patient has primary care physician: Yes Home environment has been reviewed: reviewed Prior level of function:: independent Prior/Current Home Services: No current home services Social Drivers of Health Review: SDOH reviewed no interventions necessary Readmission risk has been reviewed: Yes Transition of care needs: no transition of care needs at this time

## 2023-09-16 NOTE — Care Management Obs Status (Signed)
 MEDICARE OBSERVATION STATUS NOTIFICATION   Patient Details  Name: Robert Crosby MRN: [id number] Date of Birth: 21-Sep-1955   Medicare Observation Status Notification Given:  Yes    Sudie Erminio Deems, RN 09/16/2023, 2:56 PM

## 2023-09-17 ENCOUNTER — Other Ambulatory Visit (HOSPITAL_COMMUNITY): Payer: Self-pay

## 2023-09-17 DIAGNOSIS — I10 Essential (primary) hypertension: Secondary | ICD-10-CM | POA: Diagnosis not present

## 2023-09-17 DIAGNOSIS — R079 Chest pain, unspecified: Secondary | ICD-10-CM | POA: Diagnosis not present

## 2023-09-17 DIAGNOSIS — R0789 Other chest pain: Secondary | ICD-10-CM | POA: Diagnosis not present

## 2023-09-17 MED ORDER — AMLODIPINE BESYLATE 5 MG PO TABS
5.0000 mg | ORAL_TABLET | Freq: Every day | ORAL | 0 refills | Status: DC
  Filled 2023-09-17: qty 30, 30d supply, fill #0

## 2023-09-17 MED ORDER — HYDROCHLOROTHIAZIDE 12.5 MG PO TABS
12.5000 mg | ORAL_TABLET | Freq: Every day | ORAL | Status: DC
  Administered 2023-09-17: 12.5 mg via ORAL
  Filled 2023-09-17: qty 1

## 2023-09-17 MED ORDER — HYDROCHLOROTHIAZIDE 12.5 MG PO TABS
12.5000 mg | ORAL_TABLET | Freq: Every day | ORAL | 0 refills | Status: DC
  Filled 2023-09-17: qty 30, 30d supply, fill #0

## 2023-09-17 MED ORDER — LOSARTAN POTASSIUM 50 MG PO TABS
50.0000 mg | ORAL_TABLET | Freq: Every day | ORAL | Status: DC
Start: 2023-09-17 — End: 2023-09-17

## 2023-09-17 MED ORDER — LOSARTAN POTASSIUM 50 MG PO TABS
50.0000 mg | ORAL_TABLET | Freq: Every day | ORAL | 0 refills | Status: DC
  Filled 2023-09-17: qty 30, 30d supply, fill #0

## 2023-09-17 NOTE — Progress Notes (Signed)
 Progress Note  Patient Name: Robert Crosby Date of Encounter: 09/17/2023 [facility] Cardiologist: Vina Gull, MD    Interval Summary    Breathing is good  Complains of CP under skin L chest     Vital Signs Vitals:   09/16/23 1927 09/16/23 2359 09/17/23 0637 09/17/23 0757  BP: (!) 168/96 (!) 156/84 (!) 145/90 (!) 157/98  Pulse: 94  89 86  Resp: 18 19 17 16   Temp: 98.1 F (36.7 C) 98.1 F (36.7 C) 97.8 F (36.6 C) 97.9 F (36.6 C)  TempSrc: Oral Oral Oral Oral  SpO2: 98% 96%  100%  Weight:      Height:        Intake/Output Summary (Last 24 hours) at 09/17/2023 0851 Last data filed at 09/16/2023 1800 Gross per 24 hour  Intake 840 ml  Output --  Net 840 ml      09/15/2023    8:57 AM 09/09/2023   11:25 AM 07/01/2023   10:52 AM  Last 3 Weights  Weight (lbs) 244 lb 246 lb 6.4 oz 243 lb 9.6 oz  Weight (kg) 110.678 kg 111.766 kg 110.496 kg      Telemetry/ECG  NSR  - Personally Reviewed  Physical Exam  GEN: No acute distress.  Laying flat in bed  Neck: No JVD Cardiac:  RRR, no murmur Chest   Tender to palpation L mid chest Under rib   Respiratory: Clear to auscultation bilaterally. Ext  NO LE edema    Echo   09/17/23  1. Normal mitral annulus diastolic e' velocities. Suspect the impaired  relaxation mitral inflow pattern is due to low left atrial pressure /  hypovolemia, not due to myocardial diastolic dysfunction. Left ventricular  ejection fraction, by estimation, is  60 to 65%. The left ventricle has normal function. The left ventricle has  no regional wall motion abnormalities. Left ventricular diastolic  parameters were normal.   2. Right ventricular systolic function is normal. The right ventricular  size is normal. Tricuspid regurgitation signal is inadequate for assessing  PA pressure.   3. The mitral valve is normal in structure. No evidence of mitral valve  regurgitation. No evidence of mitral stenosis.   4. The aortic valve is tricuspid.  Aortic valve regurgitation is not  visualized. Aortic valve sclerosis/calcification is present, without any  evidence of aortic stenosis.   5. The inferior vena cava is normal in size with greater than 50%  respiratory variability, suggesting right atrial pressure of 3 mmHg.  Assessment & Plan   Chest pain  - Presented with an episode of chest pain that occurred in the setting of elevated BP. Pain not pleuritic. Denies exertional chest pain  - hsTn negative x2. EKG without ischemic changes  - Pain reproducible on palpation   Atypical   - Echo normal    HTN  - BP has been as high as 175/101 this admission. He was not on antihypertensives PTA  REemains high today    - Continue amlodipine  5 mg daily  - Increase losartan  to 50 and add 12.5 hydrochlorothiazide     Can be followed as outpt   HLD  - Lipid panel this admission showed LDL 112  -Increase lipitor to 10 mg daily   Metabolic    A1C 5.8    Obese     REviewed diet      Pt OK for d/c from cardiac standpoint   We will arrange follow up in clinic    For questions or  updates, please contact [facility] Please consult [email or web address] for contact info under       Signed, Vina Gull, MD

## 2023-09-17 NOTE — Plan of Care (Signed)
  Problem: Activity: Goal: Ability to tolerate increased activity will improve Outcome: Progressing   Problem: Cardiac: Goal: Ability to achieve and maintain adequate cardiovascular perfusion will improve Outcome: Progressing   Problem: Clinical Measurements: Goal: Respiratory complications will improve Outcome: Progressing Goal: Cardiovascular complication will be avoided Outcome: Progressing   Problem: Activity: Goal: Risk for activity intolerance will decrease Outcome: Progressing   Problem: Nutrition: Goal: Adequate nutrition will be maintained Outcome: Progressing   Problem: Safety: Goal: Ability to remain free from injury will improve Outcome: Progressing

## 2023-09-17 NOTE — Plan of Care (Signed)
?  Problem: Education: ?Goal: Understanding of cardiac disease, CV risk reduction, and recovery process will improve ?Outcome: Adequate for Discharge ?Goal: Individualized Educational Video(s) ?Outcome: Adequate for Discharge ?  ?Problem: Activity: ?Goal: Ability to tolerate increased activity will improve ?Outcome: Adequate for Discharge ?  ?Problem: Cardiac: ?Goal: Ability to achieve and maintain adequate cardiovascular perfusion will improve ?Outcome: Adequate for Discharge ?  ?

## 2023-09-17 NOTE — Discharge Summary (Signed)
 Physician Discharge Summary  Robert Crosby FMW:[id number] DOB: 1955/01/13 DOA: 09/15/2023  PCP: Sherlynn Madden, MD  Admit date: 09/15/2023 Discharge date: 09/17/2023 30 Day Unplanned Readmission Risk Score    Flowsheet Row ED to Hosp-Admission (Current) from 09/15/2023 in [facility]  30 Day Unplanned Readmission Risk Score (%) 16.44 Filed at 09/16/2023 1200    This score is the patient's risk of an unplanned readmission within 30 days of being discharged (0 -100%). The score is based on dignosis, age, lab data, medications, orders, and past utilization.   Low:  0-14.9   Medium: 15-21.9   High: 22-29.9   Extreme: 30 and above          Admitted From: Home Disposition: Home  Recommendations for Outpatient Follow-up:  Follow up with PCP in 1-2 weeks Please obtain BMP/CBC in one week Follow-up with your cardiologist as scheduled by them. Please follow up with your PCP on the following pending results: Unresulted Labs (From admission, onward)    None         Home Health: None Equipment/Devices: None  Discharge Condition: Stable CODE STATUS: Full code Diet recommendation:  Diet Order             Diet Heart Room service appropriate? Yes; Fluid consistency: Thin  Diet effective now                   Subjective: Seen and examined, no complaints.  Denies any chest pain.  Eager to go home.  Brief/Interim Summary: Robert Crosby is a 68 y.o. male with past medical history  of   well controlled HIV on Biktarvy , ongoing tobacco abuse and COPD, occasional alcohol, obesity with a BMI of 35.01 BPH, history of gastric ulcer, history of hepatitis C virus, history of syphilis, history of pulmonary nodules, history of prediabetes presented with Left sided chest pain.  Admitted to hospital service.  CTA chest abdomen negative for aortic dissection or PE however it did show pattern ColeDose saber-sheath trachea suggestive of COPD.  Cardiac enzymes were negative.   Seen by cardiology.  Echo ruled out wall motion abnormality and had normal ejection fraction and no valvular heart disease.  Cleared by cardiology for discharge.  Hyperlipidemia: LDL still above goal but better than before.  Cardiology recommended continuing atorvastatin  10 mg.   Essential hypertension: BP has been as high as 175/101 this admission. He was not on antihypertensives PTA.  Started on amlodipine  5 mg and due to elevated blood pressure, losartan  increased to 50 and started on hydrochlorothiazide  12.5 mg.   HIV Resume home meds.    COPD Stable, no wheezing heard.    Morbid obesity class II: Estimated body mass index is 35.01 kg/m as calculated from the following:   Height as of this encounter: 5' 10 (1.778 m).   Weight as of this encounter: 110.7 kg.  Weight loss and diet modification counseled.  Discharge plan was discussed with patient and/or family member and they verbalized understanding and agreed with it.  Discharge Diagnoses:  Principal Problem:   Atypical chest pain Active Problems:   Hyperlipidemia   Essential hypertension    Discharge Instructions   Allergies as of 09/17/2023   No Known Allergies      Medication List     TAKE these medications    albuterol  108 (90 Base) MCG/ACT inhaler Commonly known as: VENTOLIN  HFA Inhale 2 puffs into the lungs every 4 (four) hours as needed for wheezing or shortness of breath.  amLODipine  5 MG tablet Commonly known as: NORVASC  Take 1 tablet (5 mg total) by mouth daily. Start taking on: September 18, 2023   atorvastatin  10 MG tablet Commonly known as: Lipitor Take 0.5 tablets (5 mg total) by mouth daily.   Biktarvy  50-200-25 MG Tabs tablet Generic drug: bictegravir-emtricitabine -tenofovir  AF Take 1 tablet by mouth daily.   cyclobenzaprine 10 MG tablet Commonly known as: FLEXERIL Take 10 mg by mouth 3 (three) times daily as needed for muscle spasms.   finasteride  5 MG tablet Commonly known as:  PROSCAR  Take 5 mg by mouth daily.   hydrochlorothiazide  12.5 MG tablet Commonly known as: HYDRODIURIL  Take 1 tablet (12.5 mg total) by mouth daily.   losartan  50 MG tablet Commonly known as: COZAAR  Take 1 tablet (50 mg total) by mouth daily.   melatonin 5 MG Tabs Take 5 mg by mouth at bedtime as needed (for sleep).   multivitamin with minerals Tabs tablet Take 1 tablet by mouth daily.   QC TUMERIC COMPLEX PO Take 1 tablet by mouth daily.   Spiriva HandiHaler 18 MCG inhalation capsule Generic drug: tiotropium Place 18 mcg into inhaler and inhale daily.   zolpidem  5 MG tablet Commonly known as: AMBIEN  Take 1 tablet (5 mg total) by mouth at bedtime as needed.        Follow-up Information     Sherlynn Madden, MD Follow up in 1 week(s).   Specialty: Internal Medicine Contact information: [address] [place] [address] [phone]                No Known Allergies  Consultations: Cardiology   Procedures/Studies: ECHOCARDIOGRAM COMPLETE Result Date: 09/16/2023    ECHOCARDIOGRAM REPORT   Patient Name:   Robert Crosby Date of Exam: 09/16/2023 Medical Rec #:  [id number]       Height:       70.0 in Accession #:    [id number]      Weight:       244.0 lb Date of Birth:  Jul 23, 1955      BSA:          2.271 m Patient Age:    68 years        BP:           135/85 mmHg Patient Gender: M               HR:           82 bpm. Exam Location:  Inpatient Procedure: 2D Echo, Cardiac Doppler and Color Doppler (Both Spectral and Color            Flow Doppler were utilized during procedure). Indications:    Chest pain  History:        Patient has no prior history of Echocardiogram examinations.                 Signs/Symptoms:Chest Pain; Risk Factors:Current Smoker.  Sonographer:    Juliene Rucks Referring Phys: MARIO GAILS PATEL IMPRESSIONS  1. Normal mitral annulus diastolic e' velocities. Suspect the impaired relaxation mitral inflow pattern is due to low left atrial pressure  / hypovolemia, not due to myocardial diastolic dysfunction. Left ventricular ejection fraction, by estimation, is 60 to 65%. The left ventricle has normal function. The left ventricle has no regional wall motion abnormalities. Left ventricular diastolic parameters were normal.  2. Right ventricular systolic function is normal. The right ventricular size is normal. Tricuspid regurgitation signal is inadequate for assessing PA pressure.  3. The  mitral valve is normal in structure. No evidence of mitral valve regurgitation. No evidence of mitral stenosis.  4. The aortic valve is tricuspid. Aortic valve regurgitation is not visualized. Aortic valve sclerosis/calcification is present, without any evidence of aortic stenosis.  5. The inferior vena cava is normal in size with greater than 50% respiratory variability, suggesting right atrial pressure of 3 mmHg. FINDINGS  Left Ventricle: Normal mitral annulus diastolic e' velocities. Suspect the impaired relaxation mitral inflow pattern is due to low left atrial pressure / hypovolemia, not due to myocardial diastolic dysfunction. Left ventricular ejection fraction, by estimation, is 60 to 65%. The left ventricle has normal function. The left ventricle has no regional wall motion abnormalities. The left ventricular internal cavity size was normal in size. There is no left ventricular hypertrophy. Left ventricular diastolic parameters were normal. Right Ventricle: The right ventricular size is normal. No increase in right ventricular wall thickness. Right ventricular systolic function is normal. Tricuspid regurgitation signal is inadequate for assessing PA pressure. Left Atrium: Left atrial size was normal in size. Right Atrium: Right atrial size was normal in size. Pericardium: There is no evidence of pericardial effusion. Mitral Valve: The mitral valve is normal in structure. No evidence of mitral valve regurgitation. No evidence of mitral valve stenosis. Tricuspid Valve:  The tricuspid valve is normal in structure. Tricuspid valve regurgitation is not demonstrated. Aortic Valve: The aortic valve is tricuspid. Aortic valve regurgitation is not visualized. Aortic valve sclerosis/calcification is present, without any evidence of aortic stenosis. Pulmonic Valve: The pulmonic valve was grossly normal. Pulmonic valve regurgitation is not visualized. No evidence of pulmonic stenosis. Aorta: The aortic root is normal in size and structure. Venous: The inferior vena cava is normal in size with greater than 50% respiratory variability, suggesting right atrial pressure of 3 mmHg. IAS/Shunts: No atrial level shunt detected by color flow Doppler.  LEFT VENTRICLE PLAX 2D LVIDd:         4.30 cm      Diastology LVIDs:         3.20 cm      LV e' medial:    9.03 cm/s LV PW:         1.30 cm      LV E/e' medial:  4.9 LV IVS:        1.50 cm      LV e' lateral:   11.40 cm/s LVOT diam:     2.30 cm      LV E/e' lateral: 3.9 LV SV:         58 LV SV Index:   26 LVOT Area:     4.15 cm  LV Volumes (MOD) LV vol d, MOD A2C: 100.0 ml LV vol d, MOD A4C: 159.0 ml LV vol s, MOD A2C: 53.4 ml LV vol s, MOD A4C: 68.0 ml LV SV MOD A2C:     46.6 ml LV SV MOD A4C:     159.0 ml LV SV MOD BP:      66.6 ml RIGHT VENTRICLE             IVC RV Basal diam:  3.10 cm     IVC diam: 1.30 cm RV Mid diam:    3.20 cm RV S prime:     15.50 cm/s LEFT ATRIUM             Index        RIGHT ATRIUM           Index LA diam:  3.20 cm 1.41 cm/m   RA Area:     14.80 cm LA Vol (A2C):   46.8 ml 20.60 ml/m  RA Volume:   29.20 ml  12.86 ml/m LA Vol (A4C):   53.1 ml 23.38 ml/m LA Biplane Vol: 49.9 ml 21.97 ml/m  AORTIC VALVE LVOT Vmax:   74.90 cm/s LVOT Vmean:  48.700 cm/s LVOT VTI:    0.140 m  AORTA Ao Root diam: 3.50 cm MITRAL VALVE MV Area (PHT): 3.91 cm    SHUNTS MV Decel Time: 194 msec    Systemic VTI:  0.14 m MV E velocity: 44.00 cm/s  Systemic Diam: 2.30 cm MV A velocity: 76.50 cm/s MV E/A ratio:  0.58 Mihai Croitoru MD  Electronically signed by Jerel Balding MD Signature Date/Time: 09/16/2023/1:39:31 PM    Final    MR BRAIN WO CONTRAST Result Date: 09/15/2023 EXAM: MRI BRAIN WITHOUT CONTRAST 09/15/2023 04:28:33 PM TECHNIQUE: Multiplanar multisequence MRI of the head/brain was performed without the administration of intravenous contrast. COMPARISON: CT head earlier same day. CLINICAL HISTORY: Mental status change, unknown cause. Pt came in for chest pain starting this AM, woke up with a headache but then got in the shower and started having chest pain, SOB, nausea, left arm numbness. Pt with labored breathing, and intense pains, headache. FINDINGS: BRAIN AND VENTRICLES: Significantly limited evaluation of the axial DWI images. There is no evidence of restricted diffusion on coronal DWI images. Within these limitations, there are no findings to suggest large acute infarct. There are few scattered areas of T2/FLAIR intensity in the supratentorial white matter suggestive of mild chronic microvascular ischemic changes. Similar appearance of arachnoid cyst within the posterior fossa. No intracranial hemorrhage. No mass. No midline shift. No hydrocephalus. The sella is unremarkable. Normal flow voids. ORBITS: No acute abnormality. SINUSES AND MASTOIDS: No acute abnormality. BONES AND SOFT TISSUES: Normal marrow signal. No acute soft tissue abnormality. IMPRESSION: 1. Axial DWI images are significantly limited. No evidence of acute infarct on coronal DWI images. 2. No acute intracranial abnormality. 3. Mild chronic microvascular ischemic changes. Electronically signed by: Donnice Mania MD 09/15/2023 05:05 PM EDT RP Workstation: [id number]   CT Angio Chest/Abd/Pel for Dissection W and/or Wo Contrast Result Date: 09/15/2023 CLINICAL DATA:  68 year old male with headache, left side chest pain, and shortness of breath. EXAM: CT ANGIOGRAPHY CHEST, ABDOMEN AND PELVIS TECHNIQUE: Non-contrast CT of the chest was initially obtained. Multidetector  CT imaging through the chest, abdomen and pelvis was performed using the standard protocol during bolus administration of intravenous contrast. Multiplanar reconstructed images and MIPs were obtained and reviewed to evaluate the vascular anatomy. RADIATION DOSE REDUCTION: This exam was performed according to the departmental dose-optimization program which includes automated exposure control, adjustment of the mA and/or kV according to patient size and/or use of iterative reconstruction technique. CONTRAST:  OMNIPAQUE  IOHEXOL  350 MG/ML SOLN COMPARISON:  Chest radiographs this morning. CT Chest, Abdomen, and Pelvis 12/14/2022. FINDINGS: CTA CHEST FINDINGS Cardiovascular: Normal heart size. No pericardial effusion. Calcified aortic atherosclerosis. Faint calcified coronary artery atherosclerosis suspected on series 14, image 26. Following contrast negative for thoracic aortic aneurysm or dissection. And the major bilateral pulmonary arteries are enhancing and appear to be patent. Mediastinum/Nodes: Negative. No mediastinal mass or lymphadenopathy. Lungs/Pleura: Saber sheath trachea configuration (series 9, image 33). Mildly lower lung volumes compared to last year. Mild right lateral tracheal wall nodularity versus retained secretions, new from last year. Major airways remain patent. But generalized bilateral bronchial wall thickening, chronic and stable. Multiple  small bilateral lung nodules are stable from last year (annotated on series 9), most are subpleural, and these appear to be benign. No superimposed pleural effusion, consolidation, or active parenchymal lung infection identified. Mild costophrenic angle scarring and atelectasis. Musculoskeletal: Stable. No acute or suspicious osseous lesion in the chest. Review of the MIP images confirms the above findings. CTA ABDOMEN AND PELVIS FINDINGS VASCULAR Aortoiliac calcified atherosclerosis. Normal caliber abdominal aorta. Negative for dissection. Major  arterial branches in the abdomen and pelvis remain patent. Proximal femoral arteries are patent. No portal venous system contrast. Review of the MIP images confirms the above findings. NON-VASCULAR Hepatobiliary: Stable and negative. No pericholecystic inflammation. No bile duct enlargement. Pancreas: Fatty pancreatic atrophy has not significantly changed. Spleen: Stable and negative.  Small splenule, normal variant. Adrenals/Urinary Tract: Normal adrenal glands. Nonobstructed kidneys with chronic simple density renal cysts (no follow-up imaging recommended). Symmetric renal enhancement. Diminutive ureters and bladder. Stomach/Bowel: Sigmoid colon redundancy and diverticulosis without active inflammation. Mild large bowel retained stool otherwise. Mild diverticula at the hepatic flexure. Evidence of prior appendectomy. Decompressed terminal ileum and nondilated small bowel. Stomach and duodenum appear negative. No pneumoperitoneum, free fluid, or mesenteric inflammation identified. Lymphatic: Stable subcentimeter abdominal and pelvic lymph nodes since last year, within normal limits. Reproductive: Small chronic fat containing right inguinal hernia is stable. Other: No pelvis free fluid. Musculoskeletal: Stable. Lower lumbar facet arthropathy. No acute or suspicious osseous lesion. Review of the MIP images confirms the above findings. IMPRESSION: 1. Negative for aortic aneurysm or dissection. Aortic Atherosclerosis (ICD10-I70.0). 2. Positive for saber-sheath-trachea appearance strongly suggesting COPD. And generalized chronic airway/bronchial thickening in the bilateral lungs appears stable from last year, suggesting Chronic Bronchitis. Mild distal trachea nodularity and/or retained secretions. No pneumonia or pleural effusion. Also small bilateral lung nodules stable since December 2024, most likely benign, but repeat noncontrast Chest CT in December 2026 recommended to document two years of stability. 3. No other  acute or inflammatory process identified in the chest, abdomen, or pelvis. Occasional large bowel diverticula without active inflammation. Electronically Signed   By: VEAR Hurst M.D.   On: 09/15/2023 10:28   CT Head Wo Contrast Result Date: 09/15/2023 CLINICAL DATA:  Headache. EXAM: CT HEAD WITHOUT CONTRAST TECHNIQUE: Contiguous axial images were obtained from the base of the skull through the vertex without intravenous contrast. RADIATION DOSE REDUCTION: This exam was performed according to the departmental dose-optimization program which includes automated exposure control, adjustment of the mA and/or kV according to patient size and/or use of iterative reconstruction technique. COMPARISON:  March 06, 2009 FINDINGS: Brain: No evidence of acute infarction, hemorrhage, hydrocephalus, extra-axial collection or mass lesion/mass effect. A stable 2.1 cm x 1.9 cm arachnoid cyst is seen within the posterior fossa, to the left of midline. Vascular: No hyperdense vessel or unexpected calcification. Skull: Normal. Negative for fracture or focal lesion. Sinuses/Orbits: No acute finding. Other: None. IMPRESSION: 1. No acute intracranial abnormality. 2. Stable posterior fossa arachnoid cyst. Electronically Signed   By: Suzen Dials M.D.   On: 09/15/2023 10:19   DG Chest 2 View Result Date: 09/15/2023 CLINICAL DATA:  68 year old male with left side chest pain and shortness of breath. Smoker. EXAM: CHEST - 2 VIEW COMPARISON:  CT Chest, Abdomen, and Pelvis today are reported separately. 12/14/2022 and earlier. FINDINGS: Portable AP semi upright view at 0913 hours. Lower lung volumes compared to last year. Mediastinal contours remain normal. Visualized tracheal air column is within normal limits. Allowing for portable technique the lungs are clear.  No pneumothorax or pleural effusion. Paucity of bowel gas in the upper abdomen. No acute osseous abnormality identified. IMPRESSION: No acute cardiopulmonary abnormality.  Electronically Signed   By: VEAR Hurst M.D.   On: 09/15/2023 09:27     Discharge Exam: Vitals:   09/17/23 0637 09/17/23 0757  BP: (!) 145/90 (!) 157/98  Pulse: 89 86  Resp: 17 16  Temp: 97.8 F (36.6 C) 97.9 F (36.6 C)  SpO2:  100%   Vitals:   09/16/23 1927 09/16/23 2359 09/17/23 0637 09/17/23 0757  BP: (!) 168/96 (!) 156/84 (!) 145/90 (!) 157/98  Pulse: 94  89 86  Resp: 18 19 17 16   Temp: 98.1 F (36.7 C) 98.1 F (36.7 C) 97.8 F (36.6 C) 97.9 F (36.6 C)  TempSrc: Oral Oral Oral Oral  SpO2: 98% 96%  100%  Weight:      Height:        General: Pt is alert, awake, not in acute distress Cardiovascular: RRR, S1/S2 +, no rubs, no gallops Respiratory: CTA bilaterally, no wheezing, no rhonchi Abdominal: Soft, NT, ND, bowel sounds + Extremities: no edema, no cyanosis    The results of significant diagnostics from this hospitalization (including imaging, microbiology, ancillary and laboratory) are listed below for reference.     Microbiology: Recent Results (from the past 240 hours)  Resp panel by RT-PCR (RSV, Flu A&B, Covid) Anterior Nasal Swab     Status: None   Collection Time: 09/15/23  9:55 AM   Specimen: Anterior Nasal Swab  Result Value Ref Range Status   SARS Coronavirus 2 by RT PCR NEGATIVE NEGATIVE Final   Influenza A by PCR NEGATIVE NEGATIVE Final   Influenza B by PCR NEGATIVE NEGATIVE Final    Comment: (NOTE) The Xpert Xpress SARS-CoV-2/FLU/RSV plus assay is intended as an aid in the diagnosis of influenza from Nasopharyngeal swab specimens and should not be used as a sole basis for treatment. Nasal washings and aspirates are unacceptable for Xpert Xpress SARS-CoV-2/FLU/RSV testing.  Fact Sheet for Patients: BloggerCourse.com  Fact Sheet for Healthcare Providers: SeriousBroker.it  This test is not yet approved or cleared by the [place]  FDA and has been authorized for detection and/or diagnosis of  SARS-CoV-2 by FDA under an Emergency Use Authorization (EUA). This EUA will remain in effect (meaning this test can be used) for the duration of the COVID-19 declaration under Section 564(b)(1) of the Act, 21 U.S.C. section 360bbb-3(b)(1), unless the authorization is terminated or revoked.     Resp Syncytial Virus by PCR NEGATIVE NEGATIVE Final    Comment: (NOTE) Fact Sheet for Patients: BloggerCourse.com  Fact Sheet for Healthcare Providers: SeriousBroker.it  This test is not yet approved or cleared by the [place]  FDA and has been authorized for detection and/or diagnosis of SARS-CoV-2 by FDA under an Emergency Use Authorization (EUA). This EUA will remain in effect (meaning this test can be used) for the duration of the COVID-19 declaration under Section 564(b)(1) of the Act, 21 U.S.C. section 360bbb-3(b)(1), unless the authorization is terminated or revoked.  Performed at [facility], [address], [place], [place] [address]      Labs: BNP (last 3 results) No results for input(s): BNP in the last 8760 hours. Basic Metabolic Panel: Recent Labs  Lab 09/15/23 0905 09/16/23 0607  NA 138 136  K 4.2 4.5  CL 104 101  CO2 19* 25  GLUCOSE 144* 133*  BUN 18 15  CREATININE 1.14 1.16  CALCIUM  8.8* 8.8*  MG  --  2.0  PHOS  --  3.6   Liver Function Tests: Recent Labs  Lab 09/15/23 1926 09/16/23 0607  AST 21 19  ALT 24 21  ALKPHOS 53 49  BILITOT 0.4 0.7  PROT 7.2 6.6  ALBUMIN 3.6 3.3*   No results for input(s): LIPASE, AMYLASE in the last 168 hours. Recent Labs  Lab 09/15/23 1926  AMMONIA 18   CBC: Recent Labs  Lab 09/15/23 0905 09/16/23 0607  WBC 7.1 8.3  NEUTROABS  --  5.3  HGB 16.1 15.4  HCT 47.8 45.5  MCV 97.0 95.4  PLT 250 236   Cardiac Enzymes: No results for input(s): CKTOTAL, CKMB, CKMBINDEX, TROPONINI in the last 168 hours. BNP: Invalid input(s):  POCBNP CBG: No results for input(s): GLUCAP in the last 168 hours. D-Dimer No results for input(s): DDIMER in the last 72 hours. Hgb A1c Recent Labs    09/15/23 1926  HGBA1C 5.8*   Lipid Profile Recent Labs    09/15/23 1926  CHOL 195  HDL 36*  LDLCALC 112*  TRIG 235*  CHOLHDL 5.4   Thyroid  function studies Recent Labs    09/15/23 1926  TSH 1.706   Anemia work up No results for input(s): VITAMINB12, FOLATE, FERRITIN, TIBC, IRON, RETICCTPCT in the last 72 hours. Urinalysis    Component Value Date/Time   COLORURINE AMBER (A) 12/14/2022 1018   APPEARANCEUR HAZY (A) 12/14/2022 1018   LABSPEC 1.030 12/14/2022 1018   PHURINE 5.0 12/14/2022 1018   GLUCOSEU NEGATIVE 12/14/2022 1018   HGBUR NEGATIVE 12/14/2022 1018   BILIRUBINUR NEGATIVE 12/14/2022 1018   KETONESUR 5 (A) 12/14/2022 1018   PROTEINUR 30 (A) 12/14/2022 1018   NITRITE NEGATIVE 12/14/2022 1018   LEUKOCYTESUR SMALL (A) 12/14/2022 1018   Sepsis Labs Recent Labs  Lab 09/15/23 0905 09/16/23 0607  WBC 7.1 8.3   Microbiology Recent Results (from the past 240 hours)  Resp panel by RT-PCR (RSV, Flu A&B, Covid) Anterior Nasal Swab     Status: None   Collection Time: 09/15/23  9:55 AM   Specimen: Anterior Nasal Swab  Result Value Ref Range Status   SARS Coronavirus 2 by RT PCR NEGATIVE NEGATIVE Final   Influenza A by PCR NEGATIVE NEGATIVE Final   Influenza B by PCR NEGATIVE NEGATIVE Final    Comment: (NOTE) The Xpert Xpress SARS-CoV-2/FLU/RSV plus assay is intended as an aid in the diagnosis of influenza from Nasopharyngeal swab specimens and should not be used as a sole basis for treatment. Nasal washings and aspirates are unacceptable for Xpert Xpress SARS-CoV-2/FLU/RSV testing.  Fact Sheet for Patients: BloggerCourse.com  Fact Sheet for Healthcare Providers: SeriousBroker.it  This test is not yet approved or cleared by the [place] FDA and has been authorized for detection and/or diagnosis of SARS-CoV-2 by FDA under an Emergency Use Authorization (EUA). This EUA will remain in effect (meaning this test can be used) for the duration of the COVID-19 declaration under Section 564(b)(1) of the Act, 21 U.S.C. section 360bbb-3(b)(1), unless the authorization is terminated or revoked.     Resp Syncytial Virus by PCR NEGATIVE NEGATIVE Final    Comment: (NOTE) Fact Sheet for Patients: BloggerCourse.com  Fact Sheet for Healthcare Providers: SeriousBroker.it  This test is not yet approved or cleared by the [place]  FDA and has been authorized for detection and/or diagnosis of SARS-CoV-2 by FDA under an Emergency Use Authorization (EUA). This EUA will remain in effect (meaning this test can be used) for the duration  of the COVID-19 declaration under Section 564(b)(1) of the Act, 21 U.S.C. section 360bbb-3(b)(1), unless the authorization is terminated or revoked.  Performed at [facility], [address] [place], [place] [address]     FURTHER DISCHARGE INSTRUCTIONS:   Get Medicines reviewed and adjusted: Please take all your medications with you for your next visit with your Primary MD   Laboratory/radiological data: Please request your Primary MD to go over all hospital tests and procedure/radiological results at the follow up, please ask your Primary MD to get all Hospital records sent to his/her office.   In some cases, they will be blood work, cultures and biopsy results pending at the time of your discharge. Please request that your primary care M.D. goes through all the records of your hospital data and follows up on these results.   Also Note the following: If you experience worsening of your admission symptoms, develop shortness of breath, life threatening emergency, suicidal or homicidal thoughts you must seek medical attention immediately  by calling 911 or calling your MD immediately  if symptoms less severe.   You must read complete instructions/literature along with all the possible adverse reactions/side effects for all the Medicines you take and that have been prescribed to you. Take any new Medicines after you have completely understood and accpet all the possible adverse reactions/side effects.    patient was instructed, not to drive, operate heavy machinery, perform activities at heights, swimming or participation in water activities or provide baby-sitting services while on Pain, Sleep and Anxiety Medications; until their outpatient Physician has advised to do so again. Also recommended to not to take more than prescribed Pain, Sleep and Anxiety Medications.  It is not advisable to combine anxiety, sleep and pain medications without talking with your primary care provider.     Wear Seat belts while driving.   Please note: You were cared for by a hospitalist during your hospital stay. Once you are discharged, your primary care physician will handle any further medical issues. Please note that NO REFILLS for any discharge medications will be authorized once you are discharged, as it is imperative that you return to your primary care physician (or establish a relationship with a primary care physician if you do not have one) for your post hospital discharge needs so that they can reassess your need for medications and monitor your lab values  Time coordinating discharge: Over 30 minutes  SIGNED:   Fredia Skeeter, MD  [facility] 09/17/2023, 10:26 AM *Please note that this is a verbal dictation therefore any spelling or grammatical errors are due to the Dragon Medical One system interpretation. If 7PM-7AM, please contact night-coverage [email or web address]

## 2023-09-23 ENCOUNTER — Encounter: Payer: Self-pay | Admitting: Sports Medicine

## 2023-09-23 ENCOUNTER — Other Ambulatory Visit: Payer: Self-pay

## 2023-09-23 ENCOUNTER — Ambulatory Visit: Admitting: Sports Medicine

## 2023-09-23 ENCOUNTER — Other Ambulatory Visit (HOSPITAL_COMMUNITY): Payer: Self-pay

## 2023-09-23 VITALS — BP 122/68 | HR 83 | Temp 97.8°F | Ht 70.0 in | Wt 242.2 lb

## 2023-09-23 DIAGNOSIS — J449 Chronic obstructive pulmonary disease, unspecified: Secondary | ICD-10-CM | POA: Diagnosis not present

## 2023-09-23 DIAGNOSIS — N401 Enlarged prostate with lower urinary tract symptoms: Secondary | ICD-10-CM | POA: Diagnosis not present

## 2023-09-23 DIAGNOSIS — I10 Essential (primary) hypertension: Secondary | ICD-10-CM

## 2023-09-23 DIAGNOSIS — B20 Human immunodeficiency virus [HIV] disease: Secondary | ICD-10-CM

## 2023-09-23 DIAGNOSIS — Z09 Encounter for follow-up examination after completed treatment for conditions other than malignant neoplasm: Secondary | ICD-10-CM

## 2023-09-23 MED ORDER — LOSARTAN POTASSIUM 50 MG PO TABS: 50.0000 mg | ORAL_TABLET | Freq: Every day | ORAL | 0 refills | Status: DC

## 2023-09-23 MED ORDER — HYDROCHLOROTHIAZIDE 12.5 MG PO TABS: 12.5000 mg | ORAL_TABLET | Freq: Every day | ORAL | 0 refills | Status: DC

## 2023-09-23 MED ORDER — AMLODIPINE BESYLATE 5 MG PO TABS: 5.0000 mg | ORAL_TABLET | Freq: Every day | ORAL | 0 refills | Status: DC

## 2023-09-23 NOTE — Progress Notes (Unsigned)
 Careteam: Patient Care Team: Sherlynn Madden, MD as PCP - General (Internal Medicine) Okey Vina GAILS, MD as PCP - Cardiology (Cardiology) Dennise Kingsley, MD as Consulting Physician (Infectious Diseases) Ernesto Paulita SAILOR, MD as Referring Physician (Internal Medicine)  PLACE OF SERVICE:  [facility]  Advanced Directive information    No Known Allergies  Chief Complaint  Patient presents with   Acute Visit    Patient needed 3 medications filled regarding a months supply // medications currently pending.      Discussed the use of AI scribe software for clinical note transcription with the patient, who gave verbal consent to proceed.  History of Present Illness   68 y.o. male with past medical history  of   well controlled HIV on Biktarvy , ongoing tobacco abuse and COPD, occasional alcohol, obesity with a BMI of 35.01 BPH, history of gastric ulcer, history of hepatitis C virus, history of syphilis, history of pulmonary nodules, history of prediabetes is here for hospital follow up  Recently hospitalized for left sided chest pain   CTA chest abdomen negative for aortic dissection or PE however it did show pattern ColeDose saber-sheath trachea suggestive of COPD.  Cardiac enzymes were negative   Echo ruled out wall motion abnormality and had normal ejection fraction and no valvular heart disease.   Review of Systems:  Review of Systems  Constitutional:  Negative for chills and fever.  HENT:  Negative for congestion and sore throat.   Respiratory:  Negative for cough, sputum production and shortness of breath.   Cardiovascular:  Negative for chest pain, palpitations and leg swelling.  Gastrointestinal:  Negative for abdominal pain, heartburn and nausea.  Genitourinary:  Negative for dysuria, frequency and hematuria.  Musculoskeletal:  Negative for falls and myalgias.  Neurological:  Negative for dizziness.   Negative unless indicated in HPI.   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 He has a 7.5 pack-year smoking history. He has never used smokeless tobacco. He reports current alcohol use. He reports that he does not currently use drugs.  Family History  Problem Relation Age of Onset   Cancer Mother    Cancer Father     Medications: Patient's Medications  New Prescriptions   No medications on file  Previous Medications   ALBUTEROL  (VENTOLIN  HFA) 108 (90 BASE) MCG/ACT INHALER    Inhale 2 puffs into the lungs every 4 (four) hours as needed for wheezing or shortness of breath.   AMLODIPINE  (NORVASC ) 5 MG TABLET    Take 1 tablet (5 mg total) by mouth daily.   ATORVASTATIN  (LIPITOR) 10 MG TABLET    Take 0.5 tablets (5 mg total) by mouth daily.   BICTEGRAVIR-EMTRICITABINE -TENOFOVIR  AF (BIKTARVY ) 50-200-25 MG TABS TABLET    Take 1 tablet by mouth daily.   CYCLOBENZAPRINE (FLEXERIL) 10 MG TABLET    Take 10 mg by mouth 3 (three) times daily as needed for muscle spasms.   FINASTERIDE  (PROSCAR ) 5 MG TABLET    Take 5 mg by mouth daily.   HYDROCHLOROTHIAZIDE  (HYDRODIURIL ) 12.5 MG TABLET    Take 1 tablet (12.5 mg total) by mouth daily.   LOSARTAN  (COZAAR ) 50 MG TABLET    Take 1 tablet (50  mg total) by mouth daily.   MELATONIN 5 MG TABS    Take 5 mg by mouth at bedtime as needed (for sleep).   MULTIPLE VITAMIN (MULTIVITAMIN WITH MINERALS) TABS TABLET    Take 1 tablet by mouth daily.   SPIRIVA HANDIHALER 18 MCG INHALATION CAPSULE    Place 18 mcg into inhaler and inhale daily.   TURMERIC (QC TUMERIC COMPLEX PO)    Take 1 tablet by mouth daily.   ZOLPIDEM  (AMBIEN ) 5 MG TABLET    Take 1 tablet (5 mg total) by mouth at bedtime as needed.  Modified Medications   No medications on file  Discontinued  Medications   No medications on file    Physical Exam: Vitals:   09/23/23 0914  BP: 122/68  Pulse: 83  Temp: 97.8 F (36.6 C)  SpO2: 94%  Weight: 242 lb 3.2 oz (109.9 kg)  Height: 5' 10 (1.778 m)   Body mass index is 34.75 kg/m. BP Readings from Last 3 Encounters:  09/23/23 122/68  09/17/23 (!) 142/88  09/09/23 (!) 158/100   Wt Readings from Last 3 Encounters:  09/23/23 242 lb 3.2 oz (109.9 kg)  09/15/23 244 lb (110.7 kg)  09/09/23 246 lb 6.4 oz (111.8 kg)    Physical Exam Constitutional:      Appearance: Normal appearance.  HENT:     Head: Normocephalic and atraumatic.  Cardiovascular:     Rate and Rhythm: Normal rate and regular rhythm.     Pulses: Normal pulses.     Heart sounds: Normal heart sounds.  Pulmonary:     Effort: No respiratory distress.     Breath sounds: No stridor. No wheezing or rales.  Abdominal:     General: Bowel sounds are normal. There is no distension.     Palpations: Abdomen is soft.     Tenderness: There is no abdominal tenderness. There is no guarding.  Musculoskeletal:        General: No swelling.  Neurological:     Mental Status: He is alert. Mental status is at baseline.     Motor: No weakness.     Labs reviewed: Basic Metabolic Panel: Recent Labs    12/14/22 1147 01/06/23 1049 06/24/23 1112 09/15/23 0905 09/15/23 1926 09/16/23 0607  NA  --    < > 137 138  --  136  K  --    < > 4.6 4.2  --  4.5  CL  --    < > 104 104  --  101  CO2  --    < > 26 19*  --  25  GLUCOSE  --    < > 96 144*  --  133*  BUN  --    < > 14 18  --  15  CREATININE  --    < > 1.07 1.14  --  1.16  CALCIUM   --    < > 9.3 8.8*  --  8.8*  MG 2.2  --   --   --   --  2.0  PHOS  --   --   --   --   --  3.6  TSH  --   --   --   --  1.706  --    < > = values in this interval not displayed.   Liver Function Tests: Recent Labs    12/14/22 1018 01/06/23 1049 06/24/23 1112 09/15/23 1926 09/16/23 0607  AST 23   < > 21 21 19  ALT 26   < > 27 24 21    ALKPHOS 49  --   --  53 49  BILITOT 0.6   < > 0.5 0.4 0.7  PROT 7.3   < > 7.3 7.2 6.6  ALBUMIN 3.7  --   --  3.6 3.3*   < > = values in this interval not displayed.   Recent Labs    12/14/22 1018  LIPASE 78*   Recent Labs    09/15/23 1926  AMMONIA 18   CBC: Recent Labs    01/06/23 1049 04/22/23 1356 09/15/23 0905 09/16/23 0607  WBC 7.1 6.0 7.1 8.3  NEUTROABS 4,061 3,186  --  5.3  HGB 15.6 16.0 16.1 15.4  HCT 46.2 46.9 47.8 45.5  MCV 94.5 94.7 97.0 95.4  PLT 277 276 250 236   Lipid Panel: Recent Labs    04/22/23 1356 09/15/23 1926  CHOL 173 195  HDL 48 36*  LDLCALC 97 112*  TRIG 189* 235*  CHOLHDL 3.6 5.4   TSH: Recent Labs    09/15/23 1926  TSH 1.706   A1C: Lab Results  Component Value Date   HGBA1C 5.8 (H) 09/15/2023    Assessment and Plan Assessment & Plan  1. Primary hypertension (Primary) Pt was hospitalized recently for chest pain  Cardiac work up neg Pt denies chest pain, sob, dizzy or lightheadedness Bp better today  Refilled bp meds Pt has appt with cardiology next month   - losartan  (COZAAR ) 50 MG tablet; Take 1 tablet (50 mg total) by mouth daily.  Dispense: 90 tablet; Refill: 0 - hydrochlorothiazide  (HYDRODIURIL ) 12.5 MG tablet; Take 1 tablet (12.5 mg total) by mouth daily.  Dispense: 90 tablet; Refill: 0 - amLODipine  (NORVASC ) 5 MG tablet; Take 1 tablet (5 mg total) by mouth daily.  Dispense: 90 tablet; Refill: 0  2. Chronic obstructive pulmonary disease, unspecified COPD type (HCC) Lungs clear  No wheezing  Cont with spiriva Cut down on smoking   3. Benign prostatic hyperplasia with lower urinary tract symptoms, symptom details unspecified Cont with finasteride   4. Human immunodeficiency virus (HIV) disease (HCC) Follow up with hiv clinic     No follow-ups on file.:   Chere Babson

## 2023-09-24 ENCOUNTER — Encounter: Payer: Self-pay | Admitting: Sports Medicine

## 2023-09-25 ENCOUNTER — Other Ambulatory Visit: Payer: Self-pay | Admitting: Sports Medicine

## 2023-09-25 ENCOUNTER — Other Ambulatory Visit: Payer: Self-pay

## 2023-09-25 ENCOUNTER — Encounter (INDEPENDENT_AMBULATORY_CARE_PROVIDER_SITE_OTHER): Payer: Self-pay

## 2023-09-25 DIAGNOSIS — I10 Essential (primary) hypertension: Secondary | ICD-10-CM

## 2023-09-25 NOTE — Progress Notes (Signed)
 Specialty Pharmacy Refill Coordination Note  Robert Crosby is a 68 y.o. male contacted today regarding refills of specialty medication(s) Bictegravir-Emtricitab-Tenofov (Biktarvy )   Patient requested (Patient-Rptd) Delivery   Delivery date: 09/26/23   Verified address: [address] [place]  [id number]   Medication will be filled on 09/25/23.

## 2023-09-26 ENCOUNTER — Ambulatory Visit

## 2023-09-26 ENCOUNTER — Other Ambulatory Visit: Payer: Self-pay

## 2023-09-30 ENCOUNTER — Ambulatory Visit: Admitting: General Practice

## 2023-10-16 ENCOUNTER — Other Ambulatory Visit: Payer: Self-pay

## 2023-10-21 ENCOUNTER — Other Ambulatory Visit: Payer: Self-pay

## 2023-10-21 ENCOUNTER — Other Ambulatory Visit: Payer: Self-pay | Admitting: Pharmacy Technician

## 2023-10-21 NOTE — Progress Notes (Signed)
 Specialty Pharmacy Refill Coordination Note  Robert Crosby is a 68 y.o. male contacted today regarding refills of specialty medication(s) Bictegravir-Emtricitab-Tenofov (Biktarvy )   Patient requested Delivery   Delivery date: 10/29/23   Verified address: [address]  [place]   Medication will be filled on 10/28/23.

## 2023-10-24 ENCOUNTER — Other Ambulatory Visit: Payer: Self-pay | Admitting: Medical Genetics

## 2023-10-24 DIAGNOSIS — Z006 Encounter for examination for normal comparison and control in clinical research program: Secondary | ICD-10-CM

## 2023-10-28 ENCOUNTER — Ambulatory Visit: Admitting: Sports Medicine

## 2023-10-28 ENCOUNTER — Other Ambulatory Visit: Payer: Self-pay

## 2023-11-04 NOTE — Progress Notes (Deleted)
 Cardiology Office Note:  .   Date:  11/04/2023  ID:  [name], DOB [birth year], MRN [id number] PCP: Sherlynn Madden, MD  [facility] Providers Cardiologist:  Vina Gull, MD {  History of Present Illness: .   Robert Crosby is a 68 y.o. male  with PMHx of HTN, HLD, well controlled HIV on Biktarvy , ongoing tobacco use and COPD, occasional alcohol, obesity with a BMI of 35.01, BPH, history of gastric ulcer, history of hepatitis C virus, history of syphilis, history of pulmonary nodules, history of prediabetes who reports to [facility] office for follow up.   First seen by heartcare in recent hospitalization 9/8-10/2023 for chest pain that occurred in setting of elevated BP. No exertional CP. TN negative x2. EKG w/o ischemic changes. Pain reproducible on palpation. ECHO showed EF 60-65%, no RWMA, and no valvular abnormalities. CTA negative for PE, however suggestive of COPD. Cardiology felt most likely atypical pain. BP as high as 175/101 and he not on any HTN med PTA. Discharged on amlodipine  5 mg daily, losartan  50 mg and hydrochlorothiazide  12.5 mg. Increased Lipitor to 10 mg daily due to LDL of 112.  Today, reports ### and denies ###.  Reports compliance with medications.  Dietary habitats:  Activity level:  Social: Denies tobacco use/alcohol/drug use  Denies any recent hospitalizations or visits to the emergency department.   ROS: 10 point review of system has been reviewed and considered negative except ones been listed in the HPI.   Studies Reviewed: .   Echo   09/17/23 1. Normal mitral annulus diastolic e' velocities. Suspect the impaired  relaxation mitral inflow pattern is due to low left atrial pressure /  hypovolemia, not due to myocardial diastolic dysfunction. Left ventricular  ejection fraction, by estimation, is  60 to 65%. The left ventricle has normal function. The left ventricle has  no regional wall motion abnormalities. Left ventricular diastolic   parameters were normal.   2. Right ventricular systolic function is normal. The right ventricular  size is normal. Tricuspid regurgitation signal is inadequate for assessing  PA pressure.   3. The mitral valve is normal in structure. No evidence of mitral valve  regurgitation. No evidence of mitral stenosis.   4. The aortic valve is tricuspid. Aortic valve regurgitation is not  visualized. Aortic valve sclerosis/calcification is present, without any  evidence of aortic stenosis.   5. The inferior vena cava is normal in size with greater than 50%  respiratory variability, suggesting right atrial pressure of 3 mmHg.  Risk Assessment/Calculations:   {Does this patient have ATRIAL FIBRILLATION?[phone]} No BP recorded.  {Refresh Note OR Click here to enter BP  :1}***       Physical Exam:   VS:  There were no vitals taken for this visit.   Wt Readings from Last 3 Encounters:  09/23/23 242 lb 3.2 oz (109.9 kg)  09/15/23 244 lb (110.7 kg)  09/09/23 246 lb 6.4 oz (111.8 kg)    GEN: Well nourished, well developed in no acute distress while sitting in chair.  NECK: No JVD; No carotid bruits CARDIAC: ***RRR, no murmurs, rubs, gallops RESPIRATORY:  Clear to auscultation without rales, wheezing or rhonchi  ABDOMEN: Soft, non-tender, non-distended EXTREMITIES:  No edema; No deformity   ASSESSMENT AND PLAN: .   ***    {Are you ordering a CV Procedure (e.g. stress test, cath, DCCV, TEE, etc)?   Press F2        :[id number]}  Dispo: ***  Signed, Lorette CINDERELLA Kapur,  PA-C

## 2023-11-05 ENCOUNTER — Ambulatory Visit: Admitting: General Practice

## 2023-11-20 ENCOUNTER — Other Ambulatory Visit: Payer: Self-pay

## 2023-11-21 ENCOUNTER — Other Ambulatory Visit (HOSPITAL_COMMUNITY): Payer: Self-pay

## 2023-11-21 ENCOUNTER — Encounter (INDEPENDENT_AMBULATORY_CARE_PROVIDER_SITE_OTHER): Payer: Self-pay

## 2023-11-21 ENCOUNTER — Other Ambulatory Visit: Payer: Self-pay

## 2023-11-21 NOTE — Progress Notes (Signed)
 Specialty Pharmacy Refill Coordination Note  MyChart Questionnaire Submission  Robert Crosby is a 68 y.o. male contacted today regarding refills of specialty medication(s) Biktarvy .  Doses on hand: (Patient-Rptd) 8   Patient requested: (Patient-Rptd) Delivery   Delivery date: 11/25/23  Verified address: [address] [place] [address]  Medication will be filled on 11/24/23.

## 2023-11-24 ENCOUNTER — Other Ambulatory Visit: Payer: Self-pay

## 2023-11-30 ENCOUNTER — Other Ambulatory Visit: Payer: Self-pay | Admitting: Internal Medicine

## 2023-11-30 DIAGNOSIS — B20 Human immunodeficiency virus [HIV] disease: Secondary | ICD-10-CM

## 2023-12-05 ENCOUNTER — Encounter (HOSPITAL_COMMUNITY): Payer: Self-pay

## 2023-12-05 ENCOUNTER — Ambulatory Visit (INDEPENDENT_AMBULATORY_CARE_PROVIDER_SITE_OTHER)

## 2023-12-05 ENCOUNTER — Ambulatory Visit (HOSPITAL_COMMUNITY)
Admission: RE | Admit: 2023-12-05 | Discharge: 2023-12-05 | Disposition: A | Discharge status: Home / Self Care | Source: Ambulatory Visit | Attending: Emergency Medicine | Admitting: Emergency Medicine

## 2023-12-05 VITALS — BP 129/87 | HR 90 | Temp 98.3°F | Resp 16

## 2023-12-05 DIAGNOSIS — M19072 Primary osteoarthritis, left ankle and foot: Secondary | ICD-10-CM | POA: Diagnosis not present

## 2023-12-05 DIAGNOSIS — M7989 Other specified soft tissue disorders: Secondary | ICD-10-CM | POA: Diagnosis not present

## 2023-12-05 DIAGNOSIS — M79672 Pain in left foot: Secondary | ICD-10-CM

## 2023-12-05 DIAGNOSIS — M7732 Calcaneal spur, left foot: Secondary | ICD-10-CM | POA: Diagnosis not present

## 2023-12-05 MED ORDER — PREDNISONE 20 MG PO TABS: 40.0000 mg | ORAL_TABLET | Freq: Every day | ORAL | 0 refills | Status: AC

## 2023-12-05 NOTE — ED Provider Notes (Signed)
 [facility]    CSN: [id number] Arrival date & time: 12/05/23  1508      History   Chief Complaint Chief Complaint  Patient presents with   Foot Pain    Believe to be an attack of Gout - Entered by patient    HPI Robert Crosby is a 68 y.o. male.   Patient presents with pain and swelling to his left foot that began on 11/24.  Patient states that the pain and swelling have progressively worsened since then and he is now having some difficulty moving his toe due to the pain.  Patient states that the swelling does seem to have decreased over the last day.  Patient reports he has been taking Advil with some relief.  Patient denies any history of gout but is concerned that this may be new onset of gout.  The history is provided by the patient and medical records.  Foot Pain    Past Medical History:  Diagnosis Date   Arthritis 2014   Hand   COPD (chronic obstructive pulmonary disease) (HCC)    HIV (human immunodeficiency virus infection) (HCC)     Patient Active Problem List   Diagnosis Date Noted   Essential hypertension 09/17/2023   Atypical chest pain 09/15/2023   Unilateral primary osteoarthritis of first carpometacarpal joint, right hand 06/16/2023   Multiple nodules of lung 12/24/2022   Gastric ulcer 12/01/2022   Dysphagia 06/24/2022   Insomnia 06/13/2022   Tobacco dependence in remission 06/13/2022   Hardening of the aorta (main artery of the heart) 06/11/2022   History of hepatitis C 06/11/2022   History of syphilis 06/11/2022   Hyperlipidemia 06/11/2022   Prediabetes 06/11/2022   Recurrent major depression in remission 06/11/2022   Compulsive gambling 06/01/2021   Other stimulant abuse, 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 Authorizing Provider  predniSONE  (DELTASONE ) 20 MG tablet Take 2 tablets (40 mg total) by mouth daily for 5 days. 12/05/23 12/10/23 Yes Johnie, Demian Maisel A, NP  albuterol  (VENTOLIN  HFA) 108 (90 Base) MCG/ACT inhaler Inhale 2 puffs into the lungs every 4 (four) hours as needed for wheezing or shortness of breath. 05/01/22   Kingsley, Victoria K, DO  amLODipine  (NORVASC ) 5 MG tablet TAKE 1 TABLET(5 MG) BY MOUTH DAILY 09/25/23   Sherlynn Madden, MD  bictegravir-emtricitabine -tenofovir  AF (BIKTARVY ) 50-200-25 MG TABS tablet Take 1 tablet by mouth daily. 08/22/23   Dennise Kingsley, MD  cyclobenzaprine (FLEXERIL) 10 MG tablet Take 10 mg by mouth 3 (three) times daily as needed for muscle spasms.    [provider]  finasteride  (PROSCAR ) 5 MG tablet Take 5 mg by mouth daily. 01/09/22   [provider]  hydrochlorothiazide  (HYDRODIURIL ) 12.5 MG tablet TAKE 1 TABLET(12.5 MG) BY MOUTH DAILY 09/25/23   Sherlynn Madden, MD  losartan  (COZAAR ) 50 MG tablet TAKE 1 TABLET(50 MG) BY MOUTH DAILY 09/25/23   Sherlynn Madden, MD  melatonin 5 MG TABS Take 5 mg by mouth  at bedtime as needed (for sleep).    [provider]  Multiple Vitamin (MULTIVITAMIN WITH MINERALS) TABS tablet Take 1 tablet by mouth daily.    [provider]  SPIRIVA HANDIHALER 18 MCG inhalation capsule Place 18 mcg into inhaler and inhale daily. 03/03/23   [provider]  Turmeric (QC TUMERIC COMPLEX PO) Take 1 tablet by mouth daily.    [provider]  zolpidem  (AMBIEN ) 5 MG tablet Take 1 tablet (5 mg total) by mouth at bedtime as needed. 07/01/23   Sherlynn Madden, MD    Family  History Family History  Problem Relation Age of Onset   Cancer Mother    Cancer Father     Social History Social History   Tobacco Use   Smoking status: Every Day    Current packs/day: 0.50    Average packs/day: 0.5 packs/day for 15.0 years (7.5 ttl pk-yrs)    Types: Cigarettes   Smokeless tobacco: Never   Tobacco comments:    On a break  while in the hospital for about 2 days .[name] Currently now smoking   Substance Use Topics   Alcohol use: Yes   Drug use: Not Currently     Allergies   Patient has no known allergies.   Review of Systems Review of Systems  Per HPI  Physical Exam Triage Vital Signs ED Triage Vitals  Encounter Vitals Group     BP 12/05/23 1543 129/87     Girls Systolic BP Percentile --      Girls Diastolic BP Percentile --      Boys Systolic BP Percentile --      Boys Diastolic BP Percentile --      Pulse Rate 12/05/23 1543 90     Resp 12/05/23 1543 16     Temp 12/05/23 1543 98.3 F (36.8 C)     Temp Source 12/05/23 1543 Oral     SpO2 12/05/23 1543 93 %     Weight --      Height --      Head Circumference --      Peak Flow --      Pain Score 12/05/23 1541 10     Pain Loc --      Pain Education --      Exclude from Growth Chart --    No data found.  Updated Vital Signs BP 129/87 (BP Location: Right Arm)   Pulse 90   Temp 98.3 F (36.8 C) (Oral)   Resp 16   SpO2 93%   Visual Acuity Right Eye Distance:   Left Eye Distance:   Bilateral Distance:    Right Eye Near:   Left Eye Near:    Bilateral Near:     Physical Exam Vitals and nursing note reviewed.  Constitutional:      General: He is awake. He is not in acute distress.    Appearance: Normal appearance. He is well-developed and well-groomed. He is not ill-appearing.  Musculoskeletal:     Left foot: Decreased range of motion. Swelling, tenderness and bony tenderness present.     Comments: Swelling, tenderness, decreased range of motion, erythema, and warmth noted over MTP  joint of left great toe.  Skin:    General: Skin is warm and dry.  Neurological:     Mental Status: He is alert.  Psychiatric:        Behavior: Behavior is cooperative.      UC Treatments / Results  Labs (all labs ordered are  listed, but only abnormal results are displayed) Labs Reviewed - No data to display  EKG   Radiology DG Foot Complete Left Result Date: 12/05/2023 CLINICAL DATA:  Left foot pain beginning 5 days ago with swelling over the first metatarsophalangeal joint. EXAM: LEFT FOOT - COMPLETE 3+ VIEW COMPARISON:  None Available. FINDINGS: No fracture. First metatarsophalangeal joint space narrowing with subchondral cystic changes small marginal osteophytes. No evidence of a marginal erosion. There is surrounding soft tissue swelling. Remaining joints are normally spaced and aligned with no degenerative/arthropathic change. Small plantar and dorsal calcaneal spurs. IMPRESSION: 1. No fracture or dislocation. 2. First metatarsophalangeal joint arthropathic changes suspected to be osteoarthritis, although an inflammatory arthropathy is also in the differential diagnosis. There is no characteristic marginal erosion to suggest gout. Electronically Signed   By: Alm Parkins M.D.   On: 12/05/2023 16:39    Procedures Procedures (including critical care time)  Medications Ordered in UC Medications - No data to display  Initial Impression / Assessment and Plan / UC Course  I have reviewed the triage vital signs and the nursing notes.  Pertinent labs & imaging results that were available during my care of the patient were reviewed by me and considered in my medical decision making (see chart for details).     Patient is overall well-appearing.  Vitals are stable.  Foot x-ray ordered.  I independently interpreted these images and there appears to be findings suspicious for osteoarthritis versus gout.  Radiology report confirms this.  Prescribed short prednisone  burst for pain  relief related to osteoarthritis.  Discussed avoiding use of NSAIDs while taking.  Discussed RICE therapy.  Recommended Tylenol  as needed for breakthrough pain.  Discussed follow-up and return precautions. Final Clinical Impressions(s) / UC Diagnoses   Final diagnoses:  Left foot pain  Osteoarthritis of left foot, unspecified osteoarthritis type     Discharge Instructions      Your x-ray did not reveal any underlying fracture or dislocation.  It did reveal evidence of osteoarthritis which could be contributing to your pain and swelling. Start taking 2 tablets of prednisone  once daily for 5 days for treatment of this. You can apply ice periodically throughout the day to help with pain and swelling as well. It is not recommended to take NSAIDs such as ibuprofen, Advil, Aleve, and naproxen while taking prednisone  as this can increase your risk of bleeding. You can take 500 to 1000 mg of Tylenol  every 6-8 hours as needed for breakthrough pain.  Do not exceed 4000 mg in 1 day. Follow-up with your primary care provider or return here as needed.     ED Prescriptions     Medication Sig Dispense Auth. Provider   predniSONE  (DELTASONE ) 20 MG tablet Take 2 tablets (40 mg total) by mouth daily for 5 days. 10 tablet Johnie Flaming A, NP      PDMP not reviewed this encounter.   Johnie Flaming A, NP 12/05/23 [phone]

## 2023-12-05 NOTE — ED Triage Notes (Signed)
 Left foot pain and swollen.  Pain started on Monday patient can not move his toe, patient has taken advil and ibuprofen

## 2023-12-05 NOTE — Discharge Instructions (Addendum)
 Your x-ray did not reveal any underlying fracture or dislocation.  It did reveal evidence of osteoarthritis which could be contributing to your pain and swelling. Start taking 2 tablets of prednisone  once daily for 5 days for treatment of this. You can apply ice periodically throughout the day to help with pain and swelling as well. It is not recommended to take NSAIDs such as ibuprofen, Advil, Aleve, and naproxen while taking prednisone  as this can increase your risk of bleeding. You can take 500 to 1000 mg of Tylenol  every 6-8 hours as needed for breakthrough pain.  Do not exceed 4000 mg in 1 day. Follow-up with your primary care provider or return here as needed.

## 2023-12-13 LAB — GENECONNECT MOLECULAR SCREEN: Genetic Analysis Overall Interpretation: NEGATIVE

## 2023-12-15 ENCOUNTER — Ambulatory Visit: Admitting: Internal Medicine

## 2023-12-19 ENCOUNTER — Other Ambulatory Visit: Payer: Self-pay | Admitting: Internal Medicine

## 2023-12-19 ENCOUNTER — Other Ambulatory Visit (HOSPITAL_COMMUNITY): Payer: Self-pay

## 2023-12-19 ENCOUNTER — Other Ambulatory Visit: Payer: Self-pay

## 2023-12-19 DIAGNOSIS — B20 Human immunodeficiency virus [HIV] disease: Secondary | ICD-10-CM

## 2023-12-19 NOTE — Progress Notes (Signed)
 Specialty Pharmacy Refill Coordination Note  MyChart Questionnaire Submission  Robert Crosby is a 68 y.o. male contacted today regarding refills of specialty medication(s) Biktarvy   Doses on hand: (Patient-Rptd) 11   Patient requested: (Patient-Rptd) Delivery   Delivery date: 12/26/23  Verified address: [address] [place] [address]  Medication will be filled on 12/25/23  This fill date is pending response to refill request from provider. Patient is aware and if they have not received fill by intended date, they must follow up with pharmacy.

## 2023-12-22 ENCOUNTER — Other Ambulatory Visit: Payer: Self-pay

## 2023-12-22 MED ORDER — BIKTARVY 50-200-25 MG PO TABS
1.0000 | ORAL_TABLET | Freq: Every day | ORAL | 0 refills | Status: DC
  Filled 2023-12-22: qty 30, 30d supply, fill #0

## 2023-12-25 ENCOUNTER — Other Ambulatory Visit: Payer: Self-pay

## 2023-12-25 ENCOUNTER — Other Ambulatory Visit (HOSPITAL_COMMUNITY): Payer: Self-pay

## 2023-12-30 ENCOUNTER — Ambulatory Visit: Payer: Self-pay

## 2023-12-30 ENCOUNTER — Encounter: Payer: Self-pay | Admitting: Adult Health

## 2023-12-30 ENCOUNTER — Ambulatory Visit: Admitting: Adult Health

## 2023-12-30 ENCOUNTER — Telehealth: Payer: Self-pay | Admitting: Family

## 2023-12-30 VITALS — BP 130/62 | HR 95 | Temp 97.2°F | Ht 70.0 in | Wt 246.2 lb

## 2023-12-30 DIAGNOSIS — N401 Enlarged prostate with lower urinary tract symptoms: Secondary | ICD-10-CM | POA: Diagnosis not present

## 2023-12-30 DIAGNOSIS — J449 Chronic obstructive pulmonary disease, unspecified: Secondary | ICD-10-CM

## 2023-12-30 DIAGNOSIS — I10 Essential (primary) hypertension: Secondary | ICD-10-CM

## 2023-12-30 DIAGNOSIS — B20 Human immunodeficiency virus [HIV] disease: Secondary | ICD-10-CM

## 2023-12-30 DIAGNOSIS — F5101 Primary insomnia: Secondary | ICD-10-CM | POA: Diagnosis not present

## 2023-12-30 DIAGNOSIS — M19072 Primary osteoarthritis, left ankle and foot: Secondary | ICD-10-CM

## 2023-12-30 MED ORDER — ZOLPIDEM TARTRATE 5 MG PO TABS: 5.0000 mg | ORAL_TABLET | Freq: Every evening | ORAL | 0 refills | Status: DC | PRN

## 2023-12-30 MED ORDER — PREDNISONE 20 MG PO TABS: 40.0000 mg | ORAL_TABLET | Freq: Every day | ORAL | 0 refills | Status: AC

## 2023-12-30 NOTE — Progress Notes (Addendum)
 "  [facility]  Provider:  Jereld Serum DNP  Code Status:  Full Code  Goals of Care:     09/16/2023   11:39 AM  Advanced Directives  Type of Advance Directive Healthcare Power of Attorney  Does patient want to make changes to medical advance directive? No - Patient declined     Chief Complaint  Patient presents with   Toe Pain    Left great toe     Discussed the use of AI scribe software for clinical note transcription with the patient, who gave verbal consent to proceed.  HPI: Patient is a 68 y.o. male seen today for an acute visit for left great toe pain.  He has a flare-up of pain in his left great toe, initially suspected to be gout. Approximately four weeks ago, he visited urgent care where an x-ray revealed arthropathic changes in the first metatarsophalangeal joint, suspected to be osteoarthritis. He was prescribed prednisone , 40 mg daily for five days, which alleviated his symptoms. However, the pain recurred last night after mopping the floor. He is seeking additional prednisone  to manage the pain until his orthopedic appointment on January 15th.  He has a history of hypertension, managed with losartan  50 mg daily, hydrochlorothiazide  12.5 mg daily, and amlodipine  5 mg daily. He also has COPD, for which he uses Spiriva 18 mcg inhaler daily and albuterol  as needed. He has HIV, managed with Biktarvy  50-200-25 mg daily, and benign prostatic hyperplasia, treated with finasteride  5 mg daily.  He experiences insomnia and uses Ambien  as needed, approximately 15 times a month, to aid sleep, especially when traveling for antique shows. He describes his sleep pattern as fragmented, often waking up during the night.  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 He lives in a senior home and does not consume alcohol regularly, having only had a glass of wine and a beer in the past year. He smokes Adult Nurse cigarettes.  No  shortness of breath or problems urinating.    Past Medical History:  Diagnosis Date   Arthritis 2014   Hand   COPD (chronic obstructive pulmonary disease) (HCC)    HIV (human immunodeficiency virus infection) (HCC)     Past Surgical History:  Procedure Laterality Date   APPENDECTOMY      Allergies[1]  Outpatient Encounter Medications as of 12/30/2023  Medication Sig   albuterol  (VENTOLIN  HFA) 108 (90 Base) MCG/ACT inhaler Inhale 2 puffs into the lungs every 4 (four) hours as needed for wheezing or shortness of breath.   amLODipine  (NORVASC ) 5 MG tablet TAKE 1 TABLET(5 MG) BY MOUTH DAILY   bictegravir-emtricitabine -tenofovir  AF (BIKTARVY ) 50-200-25 MG TABS tablet Take 1 tablet by mouth daily.   finasteride  (PROSCAR ) 5 MG tablet Take 5 mg by mouth daily.   hydrochlorothiazide  (HYDRODIURIL ) 12.5 MG tablet TAKE 1 TABLET(12.5 MG) BY MOUTH DAILY   losartan  (COZAAR ) 50 MG tablet TAKE 1 TABLET(50 MG) BY MOUTH DAILY   melatonin 5 MG TABS Take 5 mg by mouth at bedtime as needed (for sleep).   Multiple Vitamin (MULTIVITAMIN WITH MINERALS) TABS tablet Take 1 tablet by mouth daily.   predniSONE  (DELTASONE ) 20 MG tablet Take 2 tablets (40 mg total) by mouth daily with breakfast for 5 days.   SPIRIVA HANDIHALER 18 MCG inhalation capsule Place 18 mcg into inhaler and inhale daily.   Turmeric (QC TUMERIC COMPLEX PO) Take 1 tablet by mouth daily.   [DISCONTINUED] zolpidem  (AMBIEN ) 5 MG tablet Take  1 tablet (5 mg total) by mouth at bedtime as needed.   cyclobenzaprine (FLEXERIL) 10 MG tablet Take 10 mg by mouth 3 (three) times daily as needed for muscle spasms. (Patient not taking: Reported on 12/30/2023)   zolpidem  (AMBIEN ) 5 MG tablet Take 1 tablet (5 mg total) by mouth at bedtime as needed.   No facility-administered encounter medications on file as of 12/30/2023.    Review of Systems:  Review of Systems  Constitutional:  Negative for activity change, appetite change and fever.  HENT:   Negative for sore throat.   Eyes: Negative.   Cardiovascular:  Negative for chest pain and leg swelling.  Gastrointestinal:  Negative for abdominal distention, diarrhea and vomiting.  Genitourinary:  Negative for dysuria, frequency and urgency.  Musculoskeletal:        Left great toe pain  Skin:  Negative for color change.  Neurological:  Negative for dizziness and headaches.  Psychiatric/Behavioral:  Negative for behavioral problems and sleep disturbance. The patient is not nervous/anxious.     Health Maintenance  Topic Date Due   Pneumococcal Vaccine: 50+ Years (4 of 4 - PCV20 or PCV21) 06/30/2024 (Originally 05/26/2023)   COVID-19 Vaccine (11 - Moderna risk 2025-26 season) 03/21/2024   Medicare Annual Wellness (AWV)  09/08/2024   DTaP/Tdap/Td (3 - Td or Tdap) 02/25/2028   Colonoscopy  03/06/2033   Influenza Vaccine  Completed   Hepatitis C Screening  Completed   Zoster Vaccines- Shingrix  Completed   Meningococcal B Vaccine  Aged Out   Lung Cancer Screening  Discontinued   Hepatitis B Vaccines 19-59 Average Risk  Discontinued    Physical Exam: Vitals:   12/30/23 1436  BP: 130/62  Pulse: 95  Temp: (!) 97.2 F (36.2 C)  TempSrc: Temporal  SpO2: 97%  Weight: 246 lb 3.2 oz (111.7 kg)  Height: 5' 10 (1.778 m)   Body mass index is 35.33 kg/m. Physical Exam Constitutional:      General: He is not in acute distress.    Appearance: He is obese.  HENT:     Head: Normocephalic and atraumatic.     Mouth/Throat:     Mouth: Mucous membranes are moist.  Eyes:     Conjunctiva/sclera: Conjunctivae normal.  Cardiovascular:     Rate and Rhythm: Normal rate and regular rhythm.     Pulses: Normal pulses.     Heart sounds: Normal heart sounds.  Pulmonary:     Effort: Pulmonary effort is normal.     Breath sounds: Normal breath sounds.  Abdominal:     General: Bowel sounds are normal.     Palpations: Abdomen is soft.  Musculoskeletal:        General: No swelling. Normal  range of motion.     Cervical back: Normal range of motion.     Comments: Area below the right big toe, erythematous  Skin:    General: Skin is warm and dry.  Neurological:     General: No focal deficit present.     Mental Status: He is alert and oriented to person, place, and time.  Psychiatric:        Mood and Affect: Mood normal.        Behavior: Behavior normal.        Thought Content: Thought content normal.        Judgment: Judgment normal.     Labs reviewed: Basic Metabolic Panel: Recent Labs    06/24/23 1112 09/15/23 0905 09/15/23 1926 09/16/23 0607  NA  137 138  --  136  K 4.6 4.2  --  4.5  CL 104 104  --  101  CO2 26 19*  --  25  GLUCOSE 96 144*  --  133*  BUN 14 18  --  15  CREATININE 1.07 1.14  --  1.16  CALCIUM  9.3 8.8*  --  8.8*  MG  --   --   --  2.0  PHOS  --   --   --  3.6  TSH  --   --  1.706  --    Liver Function Tests: Recent Labs    06/24/23 1112 09/15/23 1926 09/16/23 0607  AST 21 21 19   ALT 27 24 21   ALKPHOS  --  53 49  BILITOT 0.5 0.4 0.7  PROT 7.3 7.2 6.6  ALBUMIN  --  3.6 3.3*   No results for input(s): LIPASE, AMYLASE in the last 8760 hours. Recent Labs    09/15/23 1926  AMMONIA 18   CBC: Recent Labs    01/06/23 1049 04/22/23 1356 09/15/23 0905 09/16/23 0607  WBC 7.1 6.0 7.1 8.3  NEUTROABS 4,061 3,186  --  5.3  HGB 15.6 16.0 16.1 15.4  HCT 46.2 46.9 47.8 45.5  MCV 94.5 94.7 97.0 95.4  PLT 277 276 250 236   Lipid Panel: Recent Labs    04/22/23 1356 09/15/23 1926  CHOL 173 195  HDL 48 36*  LDLCALC 97 112*  TRIG 189* 235*  CHOLHDL 3.6 5.4   Lab Results  Component Value Date   HGBA1C 5.8 (H) 09/15/2023    Procedures since last visit: DG Foot Complete Left Result Date: 12/05/2023 CLINICAL DATA:  Left foot pain beginning 5 days ago with swelling over the first metatarsophalangeal joint. EXAM: LEFT FOOT - COMPLETE 3+ VIEW COMPARISON:  None Available. FINDINGS: No fracture. First metatarsophalangeal joint  space narrowing with subchondral cystic changes small marginal osteophytes. No evidence of a marginal erosion. There is surrounding soft tissue swelling. Remaining joints are normally spaced and aligned with no degenerative/arthropathic change. Small plantar and dorsal calcaneal spurs. IMPRESSION: 1. No fracture or dislocation. 2. First metatarsophalangeal joint arthropathic changes suspected to be osteoarthritis, although an inflammatory arthropathy is also in the differential diagnosis. There is no characteristic marginal erosion to suggest gout. Electronically Signed   By: Alm Parkins M.D.   On: 12/05/2023 16:39    Assessment/Plan  1. Primary osteoarthritis of left foot (Primary) -  Chronic osteoarthritis confirmed via x-ray, causing pain and limited mobility. Previous prednisone  treatment effective. - Prescribed prednisone  40 mg daily for 5 days for acute flare-up. - Continue follow-up with orthopedic specialist on January 15th. - predniSONE  (DELTASONE ) 20 MG tablet; Take 2 tablets (40 mg total) by mouth daily with breakfast for 5 days.  Dispense: 10 tablet; Refill: 0  2. Primary insomnia -  managed with Ambien  as needed, no adverse effects with occasional use. - Refilled Ambien  prescription with a contract for use as needed. - zolpidem  (AMBIEN ) 5 MG tablet; Take 1 tablet (5 mg total) by mouth at bedtime as needed.  Dispense: 15 tablet; Refill: 0  3. Primary hypertension -  well-controlled with current medication regimen. - Continue losartan  50 mg daily, hydrochlorothiazide  12.5 mg daily, and amlodipine  5 mg daily.  4. Benign prostatic hyperplasia with lower urinary tract symptoms, symptom details unspecified -  managed with finasteride , no urinary symptoms reported. - Continue finasteride  5 mg daily.  5. Chronic obstructive pulmonary disease, unspecified COPD type (HCC) -  managed with Spiriva and albuterol , occasional shortness of breath, no acute exacerbations. - Continue Spiriva  18 mcg daily. - Use albuterol  inhaler as needed for shortness of breath.  6. Human immunodeficiency virus (HIV) disease (HCC) -  managed with Biktarvy  - Continue Biktarvy  50-200-25 mg daily. - Follow up with infectious disease specialist on January 5th.      Labs/tests ordered:  None   Return if symptoms worsen or fail to improve.  Avonne Berkery Medina-Vargas, NP      [1] No Known Allergies  "

## 2023-12-30 NOTE — Telephone Encounter (Signed)
 FYI Only or Action Required?: FYI only for provider: appointment scheduled on 12/30/23.  Patient was last seen in primary care on 09/23/2023 by Sherlynn Madden, MD.  Called Nurse Triage reporting Toe Pain.  Symptoms began yesterday.  Interventions attempted: OTC medications: ibuprofen.  Symptoms are: gradually worsening.  Triage Disposition: See HCP Within 4 Hours (Or PCP Triage)  Patient/caregiver understands and will follow disposition?: Yes  Last night onset of redness, swelling and 9-10/10 pain left big toe joint.  No fever, numbness or discoloration. Had an eval at [facility] on 11/28 for same symptoms and determined to be osteoarthritis, not gout. Prescribed prednisone  at that time which helped a lot. Pt requesting if this can be refilled. Advised appt needed to eval new acute issue before provider will prescribe a new medication. Pt verbalized understanding. Scheduled appt with different provider at home office d/t no PCP availability within timeframe. Advised UC or ED for worsening symptoms.   Orthopedic appt not until 1/15.  Reason for Disposition  [1] SEVERE pain (e.g., excruciating, unable to do any normal activities) AND [2] not improved after 2 hours of pain medicine  Answer Assessment - Initial Assessment Questions 1. ONSET: When did the pain start?      Last night  2. LOCATION: Where is the pain located?   (e.g., around nail, entire toe, at foot joint)      Left great toe joint  3. PAIN: How bad is the pain?    (Scale 1-10; or mild, moderate, severe)     9-10/10  4. APPEARANCE: What does the toe look like? (e.g., redness, swelling, bruising, pallor)     Red and swollen  5. CAUSE: What do you think is causing the toe pain?     Has hx of arthritis in the toe  6. OTHER SYMPTOMS: Do you have any other symptoms? (e.g., leg pain, rash, fever, numbness)     Denies  Protocols used: Toe Pain-A-AH

## 2023-12-30 NOTE — Telephone Encounter (Signed)
 First attempt to reach patient at 11:35. LM to return call to [phone]. Patient requested prednisone  refill. Not on list and called for possible triage to see why he needs prednisone .

## 2023-12-30 NOTE — Telephone Encounter (Signed)
 See nurse note

## 2023-12-30 NOTE — Telephone Encounter (Unsigned)
 Copied from CRM [id number]. Topic: Clinical - Medication Refill >> Dec 30, 2023  9:08 AM Miquel SAILOR wrote: Medication: predniSONE  (DELTASONE ) tablet 60 mg   [[id number]]   Has the patient contacted their pharmacy? Yes (Agent: If no, request that the patient contact the pharmacy for the refill. If patient does not wish to contact the pharmacy document the reason why and proceed with request.) (Agent: If yes, when and what did the pharmacy advise?)  This is the patient's preferred pharmacy:    [id number] - [place], [place] - [address] AT [facility] [address]  [place] [address] Phone: [phone] Fax: [phone]    Is this the correct pharmacy for this prescription? Yes If no, delete pharmacy and type the correct one.   Has the prescription been filled recently? Yes  Is the patient out of the medication? Yes  Has the patient been seen for an appointment in the last year OR does the patient have an upcoming appointment? Yes  Can we respond through MyChart? Yes  Agent: Please be advised that Rx refills may take up to 3 business days. We ask that you follow-up with your pharmacy.

## 2024-01-12 ENCOUNTER — Ambulatory Visit: Admitting: Internal Medicine

## 2024-01-14 ENCOUNTER — Other Ambulatory Visit: Payer: Self-pay

## 2024-01-14 ENCOUNTER — Ambulatory Visit: Admitting: Internal Medicine

## 2024-01-14 ENCOUNTER — Other Ambulatory Visit (HOSPITAL_COMMUNITY): Payer: Self-pay

## 2024-01-14 ENCOUNTER — Other Ambulatory Visit (HOSPITAL_COMMUNITY)
Admission: RE | Admit: 2024-01-14 | Discharge: 2024-01-14 | Disposition: A | Discharge status: Home / Self Care | Source: Ambulatory Visit | Attending: Internal Medicine | Admitting: Internal Medicine

## 2024-01-14 ENCOUNTER — Encounter: Payer: Self-pay | Admitting: Internal Medicine

## 2024-01-14 DIAGNOSIS — B20 Human immunodeficiency virus [HIV] disease: Secondary | ICD-10-CM | POA: Insufficient documentation

## 2024-01-14 DIAGNOSIS — R634 Abnormal weight loss: Secondary | ICD-10-CM

## 2024-01-14 DIAGNOSIS — F17200 Nicotine dependence, unspecified, uncomplicated: Secondary | ICD-10-CM

## 2024-01-14 DIAGNOSIS — R7303 Prediabetes: Secondary | ICD-10-CM

## 2024-01-14 MED ORDER — BIKTARVY 50-200-25 MG PO TABS
1.0000 | ORAL_TABLET | Freq: Every day | ORAL | 11 refills | Status: AC
  Filled 2024-01-14 – 2024-01-15 (×2): qty 30, 30d supply, fill #0

## 2024-01-14 NOTE — Progress Notes (Signed)
 "      [facility]     HPI: Robert Crosby is a 69 y.o. male presents for HIV management, on biktarvy . Not taking statin.  No new complaints.  No missed dosses . not sexually active since last visit.     Past Medical History:  Diagnosis Date   Arthritis 2014   Hand   COPD (chronic obstructive pulmonary disease) (HCC)    HIV (human immunodeficiency virus infection) (HCC)     Past Surgical History:  Procedure Laterality Date   APPENDECTOMY      Family History  Problem Relation Age of Onset   Cancer Mother    Cancer Father    Medications Ordered Prior to Encounter[1]  Allergies[2]    Lab Results HIV 1 RNA Quant  Date Value  04/22/2023 NOT DETECTED copies/mL  01/06/2023 Not Detected Copies/mL  06/10/2022 Not Detected Copies/mL   CD4 T Cell Abs (/uL)  Date Value  04/22/2023 756  04/24/2022 760   No results found for: HIV1GENOSEQ Lab Results  Component Value Date   WBC 8.3 09/16/2023   HGB 15.4 09/16/2023   HCT 45.5 09/16/2023   MCV 95.4 09/16/2023   PLT 236 09/16/2023    Lab Results  Component Value Date   CREATININE 1.16 09/16/2023   BUN 15 09/16/2023   NA 136 09/16/2023   K 4.5 09/16/2023   CL 101 09/16/2023   CO2 25 09/16/2023   Lab Results  Component Value Date   ALT 21 09/16/2023   AST 19 09/16/2023   ALKPHOS 49 09/16/2023   BILITOT 0.7 09/16/2023    Lab Results  Component Value Date   CHOL 195 09/15/2023   TRIG 235 (H) 09/15/2023   HDL 36 (L) 09/15/2023   LDLCALC 112 (H) 09/15/2023   Lab Results  Component Value Date   HAV REACTIVE (A) 05/08/2022   Lab Results  Component Value Date   HEPBSAG NON-REACTIVE 04/22/2023   HEPBSAB REACTIVE (A) 04/22/2023   No results found for: HCVAB Lab Results  Component Value Date   CHLAMYDIAWP Negative 05/08/2022   CHLAMYDIAWP Negative 05/08/2022   N Negative 05/08/2022   N Negative 05/08/2022   No results found for: GCPROBEAPT No results found for:  QUANTGOLD  Assessment/Plan  #HIV -VL ND on 4/25 -Continue BIktarvy  -F/U in 6 months     #Weight -4lb weight loss since LV   #Prediabetes Followed by PCP           #Tobacco dependence #Vaccination COVID-UTD 9/25 Flu-UTD 9/25 Monkeypox PCV 13 on 05/26/2018 Meningitis x2 dodese 2020 HepA-immune HEpB x3 dose seties(2015-216). C ab+, s Ab/ag negtaive on 05/08/22 Tdap 2020 Shingles 2020, 2020    #Elevated A1c 6.2 -Primary care visit on 6/4     #Health maintenance -Quantiferon-onegative 05/08/22 -RPR PEN x 3 doses (initial RPR 1:1204), now 1:32 on 4/17, rpr  12/30 1:8, rpr roday -HCV-Ab +, last VL ND on 05/08/22 -GC urine negaitve, oral and rectalnefative -Lipid-petavastain 4 mg, pt is not taking( he states statins due to myalgia -Colonoscopy- A year ago     #Liver biopsy #-Dysplasia screen F/M-HSIL->anoscopy in 2023 repeat in 9 months.  Patient had an anoscopy in 2024 and a liver biopsy in 2024. Records of these procedures are not available. -Obtain records from [facility] to review findings and recommendations.  Loney Stank, MD [facility] I personally spent a total of 42 minutes in the care of the patient today including  preparing to see the patient, getting/reviewing separately obtained history, performing a medically appropriate exam/evaluation, counseling and educating, placing orders, documenting clinical information in the EHR, independently interpreting results, and communicating results.     [1]  Current Outpatient Medications on File Prior to Visit  Medication Sig Dispense Refill   albuterol  (VENTOLIN  HFA) 108 (90 Base) MCG/ACT inhaler Inhale 2 puffs into the lungs every 4 (four) hours as needed for wheezing or shortness of breath. 6.7 g 0   amLODipine  (NORVASC ) 5 MG tablet TAKE 1 TABLET(5 MG) BY MOUTH DAILY 90 tablet 0   bictegravir-emtricitabine -tenofovir  AF (BIKTARVY ) 50-200-25 MG TABS tablet  Take 1 tablet by mouth daily. 30 tablet 0   finasteride  (PROSCAR ) 5 MG tablet Take 5 mg by mouth daily.     hydrochlorothiazide  (HYDRODIURIL ) 12.5 MG tablet TAKE 1 TABLET(12.5 MG) BY MOUTH DAILY 90 tablet 0   losartan  (COZAAR ) 50 MG tablet TAKE 1 TABLET(50 MG) BY MOUTH DAILY 90 tablet 0   melatonin 5 MG TABS Take 5 mg by mouth at bedtime as needed (for sleep).     Multiple Vitamin (MULTIVITAMIN WITH MINERALS) TABS tablet Take 1 tablet by mouth daily.     SPIRIVA HANDIHALER 18 MCG inhalation capsule Place 18 mcg into inhaler and inhale daily.     Turmeric (QC TUMERIC COMPLEX PO) Take 1 tablet by mouth daily.     zolpidem  (AMBIEN ) 5 MG tablet Take 1 tablet (5 mg total) by mouth at bedtime as needed. 15 tablet 0   cyclobenzaprine (FLEXERIL) 10 MG tablet Take 10 mg by mouth 3 (three) times daily as needed for muscle spasms. (Patient not taking: Reported on 01/14/2024)     No current facility-administered medications on file prior to visit.  [2] No Known Allergies  "

## 2024-01-15 ENCOUNTER — Other Ambulatory Visit: Payer: Self-pay

## 2024-01-15 ENCOUNTER — Other Ambulatory Visit (HOSPITAL_COMMUNITY): Payer: Self-pay

## 2024-01-15 LAB — URINE CYTOLOGY ANCILLARY ONLY
Chlamydia: NEGATIVE
Comment: NEGATIVE
Comment: NORMAL
Neisseria Gonorrhea: NEGATIVE

## 2024-01-15 LAB — T-HELPER CELLS (CD4) COUNT (NOT AT ARMC)
CD4 % Helper T Cell: 40 % (ref 33–65)
CD4 T Cell Abs: 893 /uL (ref 400–1790)

## 2024-01-15 NOTE — Progress Notes (Signed)
 Specialty Pharmacy Refill Coordination Note  Robert Crosby is a 69 y.o. male contacted today regarding refills of specialty medication(s) Bictegravir-Emtricitab-Tenofov (Biktarvy )   Patient requested Delivery   Delivery date: 01/23/24   Verified address: [address] [place] [address]   Medication will be filled on: 01/22/24

## 2024-01-17 ENCOUNTER — Other Ambulatory Visit: Payer: Self-pay | Admitting: Sports Medicine

## 2024-01-17 DIAGNOSIS — I10 Essential (primary) hypertension: Secondary | ICD-10-CM

## 2024-01-17 LAB — CBC WITH DIFFERENTIAL/PLATELET
Absolute Lymphocytes: 2387 {cells}/uL (ref 850–3900)
Absolute Monocytes: 532 {cells}/uL (ref 200–950)
Basophils Absolute: 49 {cells}/uL (ref 0–200)
Basophils Relative: 0.7 %
Eosinophils Absolute: 210 {cells}/uL (ref 15–500)
Eosinophils Relative: 3 %
HCT: 45.2 % (ref 39.4–51.1)
Hemoglobin: 15.1 g/dL (ref 13.2–17.1)
MCH: 32.5 pg (ref 27.0–33.0)
MCHC: 33.4 g/dL (ref 31.6–35.4)
MCV: 97.4 fL (ref 81.4–101.7)
MPV: 9.3 fL (ref 7.5–12.5)
Monocytes Relative: 7.6 %
Neutro Abs: 3822 {cells}/uL (ref 1500–7800)
Neutrophils Relative %: 54.6 %
Platelets: 293 Thousand/uL (ref 140–400)
RBC: 4.64 Million/uL (ref 4.20–5.80)
RDW: 13.7 % (ref 11.0–15.0)
Total Lymphocyte: 34.1 %
WBC: 7 Thousand/uL (ref 3.8–10.8)

## 2024-01-17 LAB — COMPLETE METABOLIC PANEL WITHOUT GFR
AG Ratio: 1.4 (calc) (ref 1.0–2.5)
ALT: 25 U/L (ref 9–46)
AST: 25 U/L (ref 10–35)
Albumin: 4.3 g/dL (ref 3.6–5.1)
Alkaline phosphatase (APISO): 55 U/L (ref 35–144)
BUN/Creatinine Ratio: 20 (calc) (ref 6–22)
BUN: 28 mg/dL — ABNORMAL HIGH (ref 7–25)
CO2: 29 mmol/L (ref 20–32)
Calcium: 9.5 mg/dL (ref 8.6–10.3)
Chloride: 101 mmol/L (ref 98–110)
Creat: 1.4 mg/dL — ABNORMAL HIGH (ref 0.70–1.28)
Globulin: 3 g/dL (ref 1.9–3.7)
Glucose, Bld: 113 mg/dL — ABNORMAL HIGH (ref 65–99)
Potassium: 4.7 mmol/L (ref 3.5–5.3)
Sodium: 136 mmol/L (ref 135–146)
Total Bilirubin: 0.5 mg/dL (ref 0.2–1.2)
Total Protein: 7.3 g/dL (ref 6.1–8.1)

## 2024-01-17 LAB — HIV-1 RNA QUANT-NO REFLEX-BLD
HIV 1 RNA Quant: NOT DETECTED {copies}/mL
HIV-1 RNA Quant, Log: NOT DETECTED {Log_copies}/mL

## 2024-01-17 LAB — T PALLIDUM AB: T Pallidum Abs: POSITIVE — AB

## 2024-01-17 LAB — SYPHILIS: RPR W/REFLEX TO RPR TITER AND TREPONEMAL ANTIBODIES, TRADITIONAL SCREENING AND DIAGNOSIS ALGORITHM: RPR Ser Ql: REACTIVE — AB

## 2024-01-17 LAB — RPR TITER: RPR Titer: 1:16 {titer} — ABNORMAL HIGH

## 2024-01-19 ENCOUNTER — Ambulatory Visit: Payer: Self-pay | Admitting: Internal Medicine

## 2024-01-19 NOTE — Progress Notes (Signed)
 Called pt, could not leave VM. RPR 1:16 not a fourfold increase(treated in 2023 at [facility]). Will monitor renal function as serum creatinine slightly elevated.  Triage: could we get health department syphilis titer and records (July, 2023)

## 2024-01-22 ENCOUNTER — Other Ambulatory Visit: Payer: Self-pay

## 2024-01-22 NOTE — Progress Notes (Signed)
 Robert Crosby                                          MRN: [id number]   01/22/2024   The [facility] Quality Team Specialist reviewed this patient medical record for the purposes of chart review for care gap closure. The following were reviewed: chart review for care gap closure-kidney health evaluation for diabetes:eGFR  and uACR.    [facility] Quality Team

## 2024-01-29 ENCOUNTER — Other Ambulatory Visit: Payer: Self-pay

## 2024-01-29 NOTE — Progress Notes (Signed)
 Specialty Pharmacy Ongoing Clinical Assessment Note  Robert Crosby is a 69 y.o. male who is being followed by the specialty pharmacy service for RxSp HIV   Patient's specialty medication(s) reviewed today: Bictegravir-Emtricitab-Tenofov (Biktarvy )   Missed doses in the last 4 weeks: 0   Patient/Caregiver did not have any additional questions or concerns.   Therapeutic benefit summary: Patient is achieving benefit   Adverse events/side effects summary: No adverse events/side effects   Patient's therapy is appropriate to: Continue    Goals Addressed             This Visit's Progress    Achieve Undetectable HIV Viral Load < 20   On track    Patient is on track. Patient will maintain adherence.  Viral load remains undetectable long term.         Follow up: 12 months  Silvano LOISE Dolly Specialty Pharmacist   Clinical Intervention Note  Clinical Intervention Notes: Patient started a suppliment with Beet Root Extract and Grape Seed Extract, no DDIs were identified with his Biktarvy .   Clinical Intervention Outcomes: Prevention of an adverse drug event   Silvano LOISE Dolly Karel Santa

## 2024-02-03 ENCOUNTER — Other Ambulatory Visit: Payer: Self-pay | Admitting: Sports Medicine

## 2024-02-03 ENCOUNTER — Other Ambulatory Visit: Payer: Self-pay

## 2024-02-03 ENCOUNTER — Other Ambulatory Visit (HOSPITAL_COMMUNITY): Payer: Self-pay

## 2024-02-03 DIAGNOSIS — I10 Essential (primary) hypertension: Secondary | ICD-10-CM

## 2024-02-03 MED ORDER — ROSUVASTATIN CALCIUM 5 MG PO TABS: 5.0000 mg | ORAL_TABLET | Freq: Every day | ORAL | 4 refills | Status: DC

## 2024-02-03 MED ORDER — TIOTROPIUM BROMIDE 18 MCG IN CAPS
2.0000 | ORAL_CAPSULE | Freq: Every day | RESPIRATORY_TRACT | 4 refills | Status: AC
  Filled 2024-02-03: qty 90, 90d supply, fill #0

## 2024-02-04 ENCOUNTER — Other Ambulatory Visit: Payer: Self-pay

## 2024-02-04 ENCOUNTER — Other Ambulatory Visit (HOSPITAL_COMMUNITY): Payer: Self-pay

## 2024-02-04 DIAGNOSIS — F5101 Primary insomnia: Secondary | ICD-10-CM

## 2024-02-04 DIAGNOSIS — I10 Essential (primary) hypertension: Secondary | ICD-10-CM

## 2024-02-04 MED ORDER — ZOLPIDEM TARTRATE 5 MG PO TABS
5.0000 mg | ORAL_TABLET | Freq: Every evening | ORAL | 1 refills | Status: AC | PRN
  Filled 2024-02-04: qty 15, 15d supply, fill #0

## 2024-02-04 MED ORDER — AMLODIPINE BESYLATE 5 MG PO TABS
5.0000 mg | ORAL_TABLET | Freq: Every day | ORAL | 1 refills | Status: AC
  Filled 2024-02-04: qty 90, 90d supply, fill #0

## 2024-02-04 MED ORDER — ROSUVASTATIN CALCIUM 5 MG PO TABS
5.0000 mg | ORAL_TABLET | Freq: Every day | ORAL | 1 refills | Status: AC
  Filled 2024-02-04: qty 90, 90d supply, fill #0

## 2024-02-04 MED ORDER — HYDROCHLOROTHIAZIDE 12.5 MG PO TABS
12.5000 mg | ORAL_TABLET | Freq: Every day | ORAL | 1 refills | Status: AC
  Filled 2024-02-04: qty 90, 90d supply, fill #0

## 2024-02-04 MED ORDER — ALBUTEROL SULFATE HFA 108 (90 BASE) MCG/ACT IN AERS
2.0000 | INHALATION_SPRAY | RESPIRATORY_TRACT | 0 refills | Status: AC | PRN
  Filled 2024-02-04: qty 6.7, 17d supply, fill #0

## 2024-02-04 MED ORDER — FINASTERIDE 5 MG PO TABS
5.0000 mg | ORAL_TABLET | Freq: Every day | ORAL | 1 refills | Status: AC
  Filled 2024-02-04 (×2): qty 90, 90d supply, fill #0

## 2024-02-04 MED ORDER — LOSARTAN POTASSIUM 50 MG PO TABS
50.0000 mg | ORAL_TABLET | Freq: Every day | ORAL | 1 refills | Status: AC
  Filled 2024-02-04: qty 90, 90d supply, fill #0

## 2024-02-04 NOTE — Addendum Note (Signed)
 Addended byBETHA LEONARDA BURDOCK C on: 02/04/2024 05:10 PM   Modules accepted: Orders

## 2024-02-04 NOTE — Telephone Encounter (Signed)
 Copied from CRM #[id number]. Topic: Clinical - Medication Refill >> Feb 04, 2024  8:46 AM Diannia H wrote: Medication: finasteride  (PROSCAR ) 5 MG tablet  Has the patient contacted their pharmacy? Yes (Agent: If no, request that the patient contact the pharmacy for the refill. If patient does not wish to contact the pharmacy document the reason why and proceed with request.) (Agent: If yes, when and what did the pharmacy advise?)  This is the patient's preferred pharmacy:   [place] [address] [place] [address] Phone: [phone] Fax: [phone]  Is this the correct pharmacy for this prescription? Yes If no, delete pharmacy and type the correct one.   Has the prescription been filled recently? No  Is the patient out of the medication? Yes  Has the patient been seen for an appointment in the last year OR does the patient have an upcoming appointment? Yes  Can we respond through MyChart? Yes  Agent: Please be advised that Rx refills may take up to 3 business days. We ask that you follow-up with your pharmacy.

## 2024-02-04 NOTE — Addendum Note (Signed)
 Addended by: SUELLEN PRICILLA BROCKS on: 02/04/2024 04:19 PM   Modules accepted: Orders

## 2024-02-04 NOTE — Telephone Encounter (Signed)
 Prescriptions sent except the controlled substance, ambien  (sending to provider to review)  Ambien  5 mg, take 1 by mouth at bedtime as needed.  Last approved: 12/29/24   Dispense Number: [id number]  Treatment agreement on file from December (will need to update at next appointment to reflect different pharmacy)

## 2024-02-04 NOTE — Telephone Encounter (Signed)
 Copied from CRM #[id number]. Topic: Clinical - Medication Question >> Feb 04, 2024  8:47 AM Diannia H wrote: Reason for CRM: Patient is calling because he is requesting to have all his medicines transferred over to [facility] [address] [place] [address] Phone: [phone] Fax: [phone] Hours: Mon-Fri 7:30a-7p; Sat 8a-4:30p; [place] 10a-2p  Could you assist? Patients callback number is [phone].

## 2024-02-05 ENCOUNTER — Other Ambulatory Visit (HOSPITAL_COMMUNITY): Payer: Self-pay

## 2024-02-05 ENCOUNTER — Other Ambulatory Visit: Payer: Self-pay

## 2024-02-09 ENCOUNTER — Other Ambulatory Visit: Payer: Self-pay

## 2024-02-10 ENCOUNTER — Other Ambulatory Visit: Payer: Self-pay

## 2024-02-13 NOTE — Hospital Course (Signed)
 HIV

## 2024-02-13 NOTE — Addendum Note (Signed)
 Encounter addended by: Dennise Kingsley, MD on: 02/13/2024 12:28 PM  Actions taken: Clinical Note Signed

## 2024-09-08 ENCOUNTER — Encounter: Payer: Self-pay | Admitting: Family

## 2024-12-20 ENCOUNTER — Ambulatory Visit: Payer: Self-pay | Admitting: Internal Medicine
